# Patient Record
Sex: Female | Born: 1985 | Race: White | Hispanic: No | Smoking: Current every day smoker
Health system: Southern US, Community
[De-identification: ages and names within clinical notes are randomized; demographics above are authoritative.]

---
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
## 2006-04-11 ENCOUNTER — Emergency Department: Payer: Self-pay

## 2006-04-19 ENCOUNTER — Emergency Department: Payer: Self-pay | Admitting: Emergency Medicine

## 2006-04-20 ENCOUNTER — Emergency Department: Payer: Self-pay | Admitting: Emergency Medicine

## 2006-05-06 ENCOUNTER — Emergency Department: Payer: Self-pay | Admitting: Emergency Medicine

## 2006-06-03 ENCOUNTER — Observation Stay: Payer: Self-pay

## 2006-08-20 ENCOUNTER — Observation Stay: Payer: Self-pay

## 2006-09-22 ENCOUNTER — Observation Stay: Payer: Self-pay | Admitting: Obstetrics and Gynecology

## 2006-09-26 ENCOUNTER — Inpatient Hospital Stay: Payer: Self-pay | Admitting: Obstetrics & Gynecology

## 2007-08-13 ENCOUNTER — Emergency Department: Payer: Self-pay | Admitting: Internal Medicine

## 2008-12-16 ENCOUNTER — Observation Stay: Payer: Self-pay

## 2009-02-05 ENCOUNTER — Ambulatory Visit: Payer: Self-pay | Admitting: Obstetrics & Gynecology

## 2009-02-06 ENCOUNTER — Inpatient Hospital Stay: Payer: Self-pay

## 2009-05-29 ENCOUNTER — Emergency Department: Payer: Self-pay

## 2009-06-04 ENCOUNTER — Emergency Department: Payer: Self-pay | Admitting: Unknown Physician Specialty

## 2012-08-11 ENCOUNTER — Inpatient Hospital Stay: Payer: Self-pay | Admitting: Psychiatry

## 2012-08-11 LAB — TSH: Thyroid Stimulating Horm: 1.4 u[IU]/mL

## 2012-08-11 LAB — URINALYSIS, COMPLETE
Blood: NEGATIVE
Glucose,UR: NEGATIVE mg/dL (ref 0–75)
Ketone: NEGATIVE
Nitrite: NEGATIVE
RBC,UR: 1 /HPF (ref 0–5)
Specific Gravity: 1.014 (ref 1.003–1.030)
Squamous Epithelial: <1
WBC UR: 1 /HPF (ref 0–5)

## 2012-08-11 LAB — COMPREHENSIVE METABOLIC PANEL
Albumin: 4.1 g/dL (ref 3.4–5.0)
Alkaline Phosphatase: 101 U/L (ref 50–136)
BUN: 15 mg/dL (ref 7–18)
Bilirubin,Total: 0.3 mg/dL (ref 0.2–1.0)
Chloride: 103 mmol/L (ref 98–107)
Co2: 24 mmol/L (ref 21–32)
Creatinine: 0.74 mg/dL (ref 0.60–1.30)
Glucose: 92 mg/dL (ref 65–99)
SGOT(AST): 24 U/L (ref 15–37)
SGPT (ALT): 25 U/L (ref 12–78)
Total Protein: 7.6 g/dL (ref 6.4–8.2)

## 2012-08-11 LAB — PREGNANCY, URINE: Pregnancy Test, Urine: NEGATIVE m[IU]/mL

## 2012-08-11 LAB — DRUG SCREEN, URINE
Barbiturates, Ur Screen: NEGATIVE (ref ?–200)
Benzodiazepine, Ur Scrn: POSITIVE (ref ?–200)
Cannabinoid 50 Ng, Ur ~~LOC~~: NEGATIVE (ref ?–50)
Cocaine Metabolite,Ur ~~LOC~~: NEGATIVE (ref ?–300)
MDMA (Ecstasy)Ur Screen: NEGATIVE (ref ?–500)
Methadone, Ur Screen: NEGATIVE (ref ?–300)
Opiate, Ur Screen: POSITIVE (ref ?–300)

## 2012-08-11 LAB — ACETAMINOPHEN LEVEL: Acetaminophen: <2 ug/mL

## 2012-08-11 LAB — CBC
HGB: 13.4 g/dL (ref 12.0–16.0)
MCH: 31.5 pg (ref 26.0–34.0)
Platelet: 265 10*3/uL (ref 150–440)
RDW: 13.3 % (ref 11.5–14.5)
WBC: 7.9 10*3/uL (ref 3.6–11.0)

## 2012-08-11 LAB — ETHANOL: Ethanol %: 0.01 % (ref 0.000–0.080)

## 2014-12-16 NOTE — H&P (Signed)
PATIENT NAME:  Christina Cohen, Christina Cohen MR#:  [id number] DATE OF BIRTH:  [birth year]  DATE OF ADMISSION:  08/11/2012  INITIAL ASSESSMENT AND PSYCHIATRIC EVALUATION  IDENTYING INFORMATION: The patient is a 29 year old white female not employed and last worked in August of 2012 and could not get a job because they found out about her felony charges. The patient is single, never married, and calls herself as homeless and has no place to go. The patient comes for inpatient hospitalization to psychiatry at [facility] with the chief compliant, "to get off of drugs.  I need help with my detox from IV usage of Opana and heroin."    HISTORY OF PRESENT ILLNESS: When the patient was asked when she last felt well, she reported just prior to admission when she took her IV drugs.  She has been using at the rate of Opana 160 mg per day, 10 bags of heroin per day, IV for the past 3 years on a continuous basis. She used it just prior to her admission.   PAST PSYCHIATRIC HISTORY: According to information obtained, she had detox done two years ago for similar problems. This detox was done at [facility] in [place], [place], and stayed for a year and started using IV drugs when her grandfather died. She reports she was given some Subutex at [facility]. No history of suicide attempts. Not being followed by any psychiatrist at this time.  FAMILY HISTORY OF MENTAL ILLNESS: No known history of mental illness. No history of suicides in the family.   FAMILY HISTORY: Raised by parents. Father is at home and does nothing. Father is living and [id number] years old. Mother stays at home. Mother is living and [id number] years old. Has 10 siblings. Close to family.   PERSONAL HISTORY: [place]. Got GED and took some college courses.   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 She last worked in August of 2013 and they let her go when they found out about her felony charges.   MILITARY HISTORY: None.  MARRIAGES: Never married. Has three children who are 754 years old, 28 years old and 10738 years old from two different men. She does not have custody of any of these children and one lives with the father and the other lives with her aunt. She gets to see them sometimes.   LEGAL CHARGES: She has been to jail three times for different things; misdemeanor, larceny and delivering control substance. She has been to prison once for delivering controlled substance. No legal charges pending, not on probation.   ALCOHOL AND DRUGS: First drink of alcohol was at a young age. No problems with alcohol drinking. No history of DWIs, never been arrested for public drunkenness. She does admit using IV heroin and Opana for the past 3-1/2 years.  She denies using crack cocaine, denies using THC. She does admit smoking nicotine cigarettes at the rate of two to three packs a day for many years.   MEDICAL HISTORY: No known history of high blood pressure, no known history of diabetes mellitus, status post three C-sections, no major injuries, no history of motor vehicle accident and has never been unconscious. No known drug allergies. She does not have any physician and goes to the emergency room as needed.  PHYSICAL EXAMINATION:  VITALS: Temperature is 98.2, pulse is 84 per minute and regular, respirations 18 per minute and regular and blood pressure is 106/70 mmHg.  HEENT: Head is normocephalic, atraumatic. Eyes PERRLA. Fundi bilaterally benign. EOMs visualized. Tympanic membranes have no exudate. Oral cavity with poor dentition.   NECK: Supple without any organomegaly, lymphadenopathy or thyromegaly.   CHEST: Normal expansion, normal breath sounds heard.  HEART: Normal S1 and S2 without any murmurs, rubs or gallops.  ABDOMEN: Soft. No organomegaly. Bowel sounds heard.   SKIN:  Tracks from IV usage of drugs can be seen.  EXTREMITIES: Nontender. Normal range of motion. No signs of injury. No pedal edema.   RECTAL/PELVIC: Deferred.  NEUROLOGIC: Gait is normal. Romberg is negative. Cranial nerves II through XII are grossly intact. DTRs are 2+ and normal. Plantars have normal response.  MENTAL STATUS EXAMINATION: The patient is dressed in hospital scrubs, appears drowsy but easily arousable, oriented to place, person and time and fully aware of the situation that brought her for admission to [facility].  Affect is neutral. She denies feeling depressed, denies feeling hopeless and helpless, denies feeling worthless or useless and she wants help for her opioid dependence. Denies auditory or visual hallucinations, denies paranoid or suspicious ideas, denies thought insertion or thought control and denies having any grandiose ideas. She can spell the word world forward, could not spell it backward because she was feeling too drowsy. Does admit to sleep and appetite disturbance when she is using drugs. Memory and recall are fair. Insight and judgment guarded.  IMPRESSION:  AXIS I:  1. Opioid dependence, chronic, continuous. 2. Nicotine dependence.   3. Substance-induced mood disorder.  AXIS II: Deferred.  AXIS III: Status post three C-sections.  AXIS IV: Severe - occupation, financial, comorbid substance abuse and being homeless.  AXIS V: GAF 20.   PLAN: The patient is admitted to [facility] for closer observation, evaluation and help. She will be started on clonidine detox for her opioid dependence problems and p.r.Cohen. medications will be given as needed. During the stay in the hospital, she will be given milieu therapy and supportive counseling. She will take part in individual and group therapy where    substance abuse will be addressed. At the time of discharge, detox will be completed and appropriate followup appointments and homeless issues will be addressed  by social services. ____________________________ Jannet Mantis. Guss Bunde, MD skc:sb D: 08/11/2012 19:10:01 ET T: 08/12/2012 10:35:13 ET JOB#: [id number]  cc: Monika Salk K. Guss Bunde, MD, <Dictator> Beau Fanny MD ELECTRONICALLY SIGNED 08/12/2012 21:24

## 2014-12-16 NOTE — Discharge Summary (Signed)
PATIENT NAME:  Christina Cohen, Christina Cohen MR#:  [id number] DATE OF BIRTH:  1986/02/04  DATE OF ADMISSION:  08/11/2012 DATE OF DISCHARGE:  08/13/2012  HOSPITAL COURSE: See dictated history and physical for details of admission.  This 29 year old woman was admitted to the hospital voluntarily on the 14th with a request for opiate dependence. She reported that she had been using heroin and other opiates and felt like she was not successful at being able to stop on her own and was concerned about having opiate withdrawal symptoms. She was not reporting symptoms of acute depression, and she denied suicidal and homicidal ideation. At the time of admission the patient indicated that she was not married. Once she was admitted, it transpired that she and her boyfriend, whom she began referring to as her husband ,were admitted at the same time, both for opiate detox. They were observed by staff engaging in inappropriate physical contact and going into one another's rooms. Staff felt that this was disruptive to the milieu and to the treatment environment. The patient was confronted with this when I first met her on the 16th.  By that time she reported that she wanted to be discharged. She denied any suicidal or homicidal ideation. She stated that she would agree to follow-up treatment in the community and that she had a place to live.  She did not present as being acutely dangerous or committable.  Discharge was done on December 16th.  At that time she was on no medication. At no time in her hospital stay did she behave in a dangerous or aggressive manner or voice any acute suicidal ideation.   DISCHARGE MEDICATIONS: None.   DISPOSITION: Discharge with follow-up to be arranged at [facility], staying at the home of her husband's family.   LABORATORY RESULTS:  Admission chemistries all normal. Alcohol 10, which is low but not undetectable. CBC normal. Salicylates slightly elevated but not toxic at 3.3. TSH  normal at 1.4. Drug screen positive for opiates and benzodiazepines. Urinalysis unremarkable. Pregnancy test negative.   MENTAL STATUS EXAM AT DISCHARGE:  Casually dressed, neatly groomed woman, looks her stated age. Cooperative with the interview. Good eye contact. Normal psychomotor activity. No tremor. Affect is reactive and euthymic. Mood stated as being okay. Thoughts are lucid without loosening of associations or delusions. Denies auditory or visual hallucinations. Denies suicidal or homicidal ideation. Shows improved judgment and insight. Normal intelligence. Alert and oriented x 4.   DIAGNOSIS PRINCIPAL AND PRIMARY: AXIS I: Opiate dependence.   SECONDARY DIAGNOSES: AXIS I: No further.  AXIS II: No diagnosis.  AXIS III: No diagnosis.  AXIS IV: Moderate to severe from ongoing opiate dependence and multiple psychosocial losses related to it.  AXIS V: Functioning at time of discharge 60.    ____________________________ Gonzella Lex, MD jtc:ct D: 08/27/2012 09:39:42 ET T: 08/27/2012 10:01:09 ET JOB#: [id number]  cc: Gonzella Lex, MD, <Dictator> Gonzella Lex MD ELECTRONICALLY SIGNED 08/27/2012 16:29

## 2014-12-21 ENCOUNTER — Emergency Department
Admit: 2014-12-21 | Disposition: A | Discharge status: Other Acute Inpatient Hospital | Payer: Self-pay | Admitting: Emergency Medicine

## 2014-12-21 ENCOUNTER — Inpatient Hospital Stay (HOSPITAL_COMMUNITY)
Admission: EM | Admit: 2014-12-21 | Discharge: 2014-12-25 | DRG: 917 | Disposition: A | Discharge status: Home / Self Care | Payer: Self-pay | Source: Other Acute Inpatient Hospital | Attending: Internal Medicine | Admitting: Internal Medicine

## 2014-12-21 ENCOUNTER — Inpatient Hospital Stay (HOSPITAL_COMMUNITY): Payer: Self-pay

## 2014-12-21 DIAGNOSIS — R4182 Altered mental status, unspecified: Secondary | ICD-10-CM | POA: Diagnosis present

## 2014-12-21 DIAGNOSIS — D696 Thrombocytopenia, unspecified: Secondary | ICD-10-CM | POA: Diagnosis not present

## 2014-12-21 DIAGNOSIS — F17210 Nicotine dependence, cigarettes, uncomplicated: Secondary | ICD-10-CM | POA: Diagnosis present

## 2014-12-21 DIAGNOSIS — Z681 Body mass index (BMI) 19 or less, adult: Secondary | ICD-10-CM

## 2014-12-21 DIAGNOSIS — F319 Bipolar disorder, unspecified: Secondary | ICD-10-CM | POA: Diagnosis present

## 2014-12-21 DIAGNOSIS — J9600 Acute respiratory failure, unspecified whether with hypoxia or hypercapnia: Secondary | ICD-10-CM | POA: Diagnosis present

## 2014-12-21 DIAGNOSIS — T403X1A Poisoning by methadone, accidental (unintentional), initial encounter: Secondary | ICD-10-CM | POA: Diagnosis present

## 2014-12-21 DIAGNOSIS — E876 Hypokalemia: Secondary | ICD-10-CM | POA: Diagnosis present

## 2014-12-21 DIAGNOSIS — F1120 Opioid dependence, uncomplicated: Secondary | ICD-10-CM | POA: Diagnosis present

## 2014-12-21 DIAGNOSIS — G92 Toxic encephalopathy: Secondary | ICD-10-CM | POA: Diagnosis present

## 2014-12-21 DIAGNOSIS — T424X1A Poisoning by benzodiazepines, accidental (unintentional), initial encounter: Principal | ICD-10-CM | POA: Diagnosis present

## 2014-12-21 DIAGNOSIS — B1920 Unspecified viral hepatitis C without hepatic coma: Secondary | ICD-10-CM | POA: Diagnosis present

## 2014-12-21 DIAGNOSIS — R400 Somnolence: Secondary | ICD-10-CM | POA: Insufficient documentation

## 2014-12-21 DIAGNOSIS — F10129 Alcohol abuse with intoxication, unspecified: Secondary | ICD-10-CM | POA: Diagnosis present

## 2014-12-21 DIAGNOSIS — E872 Acidosis: Secondary | ICD-10-CM | POA: Diagnosis present

## 2014-12-21 DIAGNOSIS — T401X1A Poisoning by heroin, accidental (unintentional), initial encounter: Secondary | ICD-10-CM | POA: Diagnosis present

## 2014-12-21 DIAGNOSIS — F19288 Other psychoactive substance dependence with other psychoactive substance-induced disorder: Secondary | ICD-10-CM | POA: Diagnosis present

## 2014-12-21 DIAGNOSIS — Z978 Presence of other specified devices: Secondary | ICD-10-CM

## 2014-12-21 LAB — DRUG SCREEN, URINE
AMPHETAMINES, UR SCREEN: NEGATIVE
Barbiturates, Ur Screen: NEGATIVE
Benzodiazepine, Ur Scrn: POSITIVE
COCAINE METABOLITE, UR ~~LOC~~: NEGATIVE
Cannabinoid 50 Ng, Ur ~~LOC~~: NEGATIVE
MDMA (Ecstasy)Ur Screen: NEGATIVE
Methadone, Ur Screen: POSITIVE
Opiate, Ur Screen: NEGATIVE
Phencyclidine (PCP) Ur S: NEGATIVE
Tricyclic, Ur Screen: NEGATIVE

## 2014-12-21 LAB — URINALYSIS, COMPLETE
BILIRUBIN, UR: NEGATIVE
BLOOD: NEGATIVE
GLUCOSE, UR: NEGATIVE mg/dL (ref 0–75)
Ketone: NEGATIVE
NITRITE: POSITIVE
Ph: 5 (ref 4.5–8.0)
Protein: NEGATIVE
SPECIFIC GRAVITY: 1.015 (ref 1.003–1.030)

## 2014-12-21 LAB — COMPREHENSIVE METABOLIC PANEL
AST: 86 U/L — AB
Albumin: 4.2 g/dL
Alkaline Phosphatase: 502 U/L — ABNORMAL HIGH
Anion Gap: 8 (ref 7–16)
BILIRUBIN TOTAL: 0.3 mg/dL
BUN: 15 mg/dL
CHLORIDE: 105 mmol/L
CO2: 26 mmol/L
Calcium, Total: 9.6 mg/dL
Creatinine: 0.91 mg/dL
EGFR (African American): >60
GLUCOSE: 92 mg/dL
Potassium: 4.1 mmol/L
SGPT (ALT): 97 U/L — ABNORMAL HIGH
SODIUM: 139 mmol/L
Total Protein: 7.9 g/dL

## 2014-12-21 LAB — CBC
HCT: 36.4 % (ref 35.0–47.0)
HGB: 12.5 g/dL (ref 12.0–16.0)
MCH: 31.3 pg (ref 26.0–34.0)
MCHC: 34.5 g/dL (ref 32.0–36.0)
MCV: 91 fL (ref 80–100)
Platelet: 210 10*3/uL (ref 150–440)
RBC: 4.01 10*6/uL (ref 3.80–5.20)
RDW: 14.8 % — ABNORMAL HIGH (ref 11.5–14.5)
WBC: 5 10*3/uL (ref 3.6–11.0)

## 2014-12-21 LAB — ETHANOL

## 2014-12-21 LAB — ACETAMINOPHEN LEVEL: Acetaminophen: <10 ug/mL

## 2014-12-21 LAB — SALICYLATE LEVEL: Salicylates, Serum: <4 mg/dL

## 2014-12-21 MED ORDER — ONDANSETRON HCL 4 MG/2ML IJ SOLN: 4.0000 mg | Freq: Four times a day (QID) | INTRAMUSCULAR | Status: DC | PRN

## 2014-12-21 MED ORDER — FENTANYL BOLUS VIA INFUSION
50.0000 ug | INTRAVENOUS | Status: DC | PRN
  Filled 2014-12-21: qty 50

## 2014-12-21 MED ORDER — FENTANYL CITRATE (PF) 100 MCG/2ML IJ SOLN: 50.0000 ug | Freq: Once | INTRAMUSCULAR | Status: DC

## 2014-12-21 MED ORDER — MIDAZOLAM HCL 2 MG/2ML IJ SOLN
2.0000 mg | INTRAMUSCULAR | Status: DC | PRN
  Administered 2014-12-22: 1 mg via INTRAVENOUS
  Filled 2014-12-21: qty 2

## 2014-12-21 MED ORDER — SODIUM CHLORIDE 0.9 % IV SOLN
25.0000 ug/h | INTRAVENOUS | Status: DC
  Filled 2014-12-21: qty 50

## 2014-12-21 MED ORDER — DOCUSATE SODIUM 50 MG/5ML PO LIQD
100.0000 mg | Freq: Two times a day (BID) | ORAL | Status: DC | PRN
  Filled 2014-12-21: qty 10

## 2014-12-21 MED ORDER — SODIUM CHLORIDE 0.9 % IV SOLN
250.0000 mL | INTRAVENOUS | Status: DC | PRN
  Administered 2014-12-22: 250 mL via INTRAVENOUS

## 2014-12-21 MED ORDER — HEPARIN SODIUM (PORCINE) 5000 UNIT/ML IJ SOLN
5000.0000 [IU] | Freq: Three times a day (TID) | INTRAMUSCULAR | Status: DC
  Administered 2014-12-22 – 2014-12-24 (×8): 5000 [IU] via SUBCUTANEOUS
  Filled 2014-12-21 (×11): qty 1

## 2014-12-21 MED ORDER — FAMOTIDINE 40 MG/5ML PO SUSR
20.0000 mg | Freq: Two times a day (BID) | ORAL | Status: DC
Start: 2014-12-22 — End: 2014-12-22
  Administered 2014-12-22: 20 mg
  Filled 2014-12-21 (×5): qty 2.5

## 2014-12-21 MED ORDER — MIDAZOLAM HCL 2 MG/2ML IJ SOLN
2.0000 mg | INTRAMUSCULAR | Status: DC | PRN
  Filled 2014-12-21: qty 2

## 2014-12-21 NOTE — H&P (Signed)
PULMONARY  / CRITICAL CARE  MEDICINE  Name: Christina Cohen MRN: [id number] DOB: 05-15-1986    LOS: 1  REFERRING [facility] :  [facility]  CHIEF COMPLAINT:  Altered mental status  BRIEF PATIENT DESCRIPTION: Christina Cohen is a 29 year old female with history of bipolar disorder who presented to the ED in police custody for altered mental status initially attributed to alcohol intoxication.  HISTORY OF PRESENT ILLNESS:  Christina Cohen is a 29 year old female with history of bipolar disorder who presented to the ED in police custody for altered mental status initially attributed to alcohol intoxication. At the police department, she was verbalizing initially but then became unresponsive after 2 blood draws were performed. She was then referred to the emergency department and found to have UDS notable for positive benzodiazepine and methadone along with a stool covered bag with 19 ovoid blue pills in her gluteal cleft. She then received ceftriaxone 1 for UA notable for nitrites and leukocyte esterase. She was intubated for inability to protect her airway and transferred here for further management.   PAST MEDICAL HISTORY :  No past medical history on file. opoid abuse, etoh abuse No past surgical history on file. Prior to Admission medications   Not on File   Allergies not on file No known DA  FAMILY HISTORY:  No family history on file. unable to obtain, intubated SOCIAL HISTORY:  has no tobacco, alcohol, and drug history on file. opoid abuse, etoh abuse  REVIEW OF SYSTEMS:  Deferred given intubation. unable to obtain  VITAL SIGNS: Temp:  [98.8 F (37.1 C)] 98.8 F (37.1 C) (04/24 2334) Pulse Rate:  [82-88] 82 (04/24 2334) Resp:  [14] 14 (04/24 2334) BP: (98)/(53) 98/53 mmHg (04/24 2334) SpO2:  [99 %-100 %] 100 % (04/24 2334) FiO2 (%):  [40 %] 40 % (04/24 2334) Weight:  [107 lb 9.4 oz (48.8 kg)] 107 lb 9.4 oz (48.8 kg) (04/24 2334) HEMODYNAMICS:   VENTILATOR SETTINGS: Vent Mode:   [-] PRVC FiO2 (%):  [40 %] 40 % Set Rate:  [14 bmp] 14 bmp Vt Set:  [400 mL] 400 mL PEEP:  [5 cmH20] 5 cmH20 Plateau Pressure:  [16 cmH20] 16 cmH20 INTAKE / OUTPUT: Intake/Output    None     PHYSICAL EXAMINATION: General: Thin Caucasian female, agitated Neuro: Agitated, unable to follow commands HEENT: PERRL, left eye with conjunctival erythema as compared to right  but no scleral icterus, ETT  Cardiac: RRR, no rubs, murmurs or gallops Pulm: clear to auscultation bilaterally in the anterior lung fields, no wheezes, rales, or rhonchi Abd: soft, nontender, nondistended, BS present Ext: warm and well perfused, no pedal  or tibial edema, multiple tattoos noted   DIAGNOSES: Active Problems:   Altered mental status   ASSESSMENT / PLAN:  PULMONARY  ASSESSMENT: Acute respiratory failure: Likely secondary to acute toxic encephalopathy Mild hilar prominence PLAN:   Continue mechanical ventilatory support: PRVC, 8 cc/KG  Check Chest X-Ray Wean cpap 5 ps 5, goal 30 min  Assess rsbi, cough strength May need lasix  CARDIOVASCULAR  ASSESSMENT:  No acute issues.  PLAN Continue following Tele Get tox screen, may need ecg  RENAL  ASSESSMENT:   pulm edema mild? At risk AG acidosis  PLAN: Recheck CMET  Limit narcs as able May need lasix hydration  GASTROINTESTINAL  ASSESSMENT:   Abnormal LFTs: Elevated AST, ALT, alkaline phosphatase at outside hospital - etoh R/o mild acute alcoholic hepatitis No residual objects in rectum  PLAN:   Recheck  CMET in am  Tf if not extubated Give Pepcid 20 mg twice daily for stress ulcer prophylaxis Acute hep panel etoh level Sal, tylenol levels   HEMATOLOGIC  ASSESSMENT:   No acute issues  PLAN:  Recheck CBC  Heparin for VTE prophylaxis  INFECTIOUS  ASSESSMENT:   No acute issues: UA at outside hospital had significant epithelial cells which raises concern for contamination.   PLAN:   Continue following fever  curve Repeat UA No asp on pcxr  ENDOCRINE  ASSESSMENT:   EOTH intox LFT elevation PLAN:   Continue following cbg hydration  NEUROLOGIC  ASSESSMENT:   Acute toxic encephalopathy: UDS notable for benzos, methadone Bipolar disorder Polysubstance abuse  PLAN:   RASS 0: sedation with Versed prn, limit Continue assessing mental status Place restraints Consider sitter order WUA May need psych  CLINICAL SUMMARY: Christina Cohen is a 29 year old female with bipolar disorder, polysubstance abuse who presented to OSH with acute toxic encephalopathy complicated by acute respiratory failure. Circumstances surrounding polysubstance abuse unknown though suicidal ideation cannot be excluded.  Heywood Iles, PGY1 Internal Medicine Pager: [phone]  12/22/2014, 12:06 AM   STAFF NOTE: Cindi Carbon, MD FACP have personally reviewed patient's available data, including medical history, events of note, physical examination and test results as part of my evaluation. I have discussed with resident/NP and other care providers such as pharmacist, RN and RRT. In addition, I personally evaluated patient and elicited key findings of: awake now, hydration, cta lungs, assess etiology LFT, weaning, assess neuro resp strength, rsbi, chem in am , send hep panel, thiamine , folic addition, get UA, ecg, tox screen The patient is critically ill with multiple organ systems failure and requires high complexity decision making for assessment and support, frequent evaluation and titration of therapies, application of advanced monitoring technologies and extensive interpretation of multiple databases.   Critical Care Time devoted to patient care services described in this note is35 Minutes. This time reflects time of care of this signee: Rory Percy, MD FACP. This critical care time does not reflect procedure time, or teaching time or supervisory time of PA/NP/Med student/Med Resident etc but could involve  care discussion time. Rest per NP/medical resident whose note is outlined above and that I agree with   Mcarthur Rossetti. Tyson Alias, MD, FACP Pgr: [phone] [facility] 12/22/2014 8:44 AM

## 2014-12-22 DIAGNOSIS — Z789 Other specified health status: Secondary | ICD-10-CM

## 2014-12-22 DIAGNOSIS — R400 Somnolence: Secondary | ICD-10-CM

## 2014-12-22 DIAGNOSIS — F11222 Opioid dependence with intoxication with perceptual disturbance: Secondary | ICD-10-CM

## 2014-12-22 DIAGNOSIS — F19288 Other psychoactive substance dependence with other psychoactive substance-induced disorder: Secondary | ICD-10-CM | POA: Diagnosis present

## 2014-12-22 DIAGNOSIS — Z978 Presence of other specified devices: Secondary | ICD-10-CM | POA: Insufficient documentation

## 2014-12-22 LAB — URINALYSIS, ROUTINE W REFLEX MICROSCOPIC
Bilirubin Urine: NEGATIVE
Glucose, UA: NEGATIVE mg/dL
Hgb urine dipstick: NEGATIVE
Ketones, ur: 15 mg/dL — AB
Nitrite: NEGATIVE
Protein, ur: NEGATIVE mg/dL
SPECIFIC GRAVITY, URINE: 1.016 (ref 1.005–1.030)
Urobilinogen, UA: 0.2 mg/dL (ref 0.0–1.0)
pH: 5.5 (ref 5.0–8.0)

## 2014-12-22 LAB — COMPREHENSIVE METABOLIC PANEL
ALK PHOS: 396 U/L — AB (ref 39–117)
ALT: 83 U/L — AB (ref 0–35)
AST: 89 U/L — ABNORMAL HIGH (ref 0–37)
Albumin: 3.4 g/dL — ABNORMAL LOW (ref 3.5–5.2)
Anion gap: 12 (ref 5–15)
BILIRUBIN TOTAL: 0.8 mg/dL (ref 0.3–1.2)
BUN: 11 mg/dL (ref 6–23)
CHLORIDE: 102 mmol/L (ref 96–112)
CO2: 22 mmol/L (ref 19–32)
Calcium: 8.3 mg/dL — ABNORMAL LOW (ref 8.4–10.5)
Creatinine, Ser: 0.9 mg/dL (ref 0.50–1.10)
GFR calc Af Amer: >90 mL/min (ref >90)
GFR calc non Af Amer: 86 mL/min — ABNORMAL LOW (ref >90)
Glucose, Bld: 80 mg/dL (ref 70–99)
Potassium: 4.4 mmol/L (ref 3.5–5.1)
Sodium: 136 mmol/L (ref 135–145)
Total Protein: 6.5 g/dL (ref 6.0–8.3)

## 2014-12-22 LAB — URINE MICROSCOPIC-ADD ON

## 2014-12-22 LAB — GLUCOSE, CAPILLARY
Glucose-Capillary: 95 mg/dL (ref 70–99)
Glucose-Capillary: 101 mg/dL — ABNORMAL HIGH (ref 70–99)
Glucose-Capillary: 130 mg/dL — ABNORMAL HIGH (ref 70–99)

## 2014-12-22 LAB — ETHANOL

## 2014-12-22 LAB — BLOOD GAS, ARTERIAL
Acid-base deficit: 0.3 mmol/L (ref 0.0–2.0)
Bicarbonate: 24.8 mEq/L — ABNORMAL HIGH (ref 20.0–24.0)
DRAWN BY: 24513
FIO2: 0.4 %
O2 Saturation: 99 %
PEEP: 5 cmH2O
PH ART: 7.339 — AB (ref 7.350–7.450)
PO2 ART: 160 mmHg — AB (ref 80.0–100.0)
Patient temperature: 98.5
RATE: 14 resp/min
TCO2: 26.2 mmol/L (ref 0–100)
VT: 400 mL
pCO2 arterial: 47.2 mmHg — ABNORMAL HIGH (ref 35.0–45.0)

## 2014-12-22 LAB — HEPATITIS PANEL, ACUTE
HCV Ab: REACTIVE — AB
HEP B C IGM: NONREACTIVE
HEP B S AG: NEGATIVE
Hep A IgM: NONREACTIVE

## 2014-12-22 LAB — CBC
HCT: 36.4 % (ref 36.0–46.0)
Hemoglobin: 12.2 g/dL (ref 12.0–15.0)
MCH: 30.7 pg (ref 26.0–34.0)
MCHC: 33.5 g/dL (ref 30.0–36.0)
MCV: 91.7 fL (ref 78.0–100.0)
PLATELETS: 164 10*3/uL (ref 150–400)
RBC: 3.97 MIL/uL (ref 3.87–5.11)
RDW: 14.3 % (ref 11.5–15.5)
WBC: 4.8 10*3/uL (ref 4.0–10.5)

## 2014-12-22 LAB — RAPID URINE DRUG SCREEN, HOSP PERFORMED
Amphetamines: NOT DETECTED
Barbiturates: NOT DETECTED
Benzodiazepines: POSITIVE — AB
Cocaine: NOT DETECTED
Opiates: NOT DETECTED
Tetrahydrocannabinol: NOT DETECTED

## 2014-12-22 LAB — MRSA PCR SCREENING: MRSA by PCR: NEGATIVE

## 2014-12-22 LAB — SALICYLATE LEVEL: Salicylate Lvl: <4 mg/dL (ref 2.8–20.0)

## 2014-12-22 LAB — ACETAMINOPHEN LEVEL: Acetaminophen (Tylenol), Serum: <10 ug/mL — ABNORMAL LOW (ref 10–30)

## 2014-12-22 MED ORDER — ADULT MULTIVITAMIN W/MINERALS CH
1.0000 | ORAL_TABLET | Freq: Every day | ORAL | Status: DC
  Administered 2014-12-23 – 2014-12-25 (×3): 1 via ORAL
  Filled 2014-12-22 (×4): qty 1

## 2014-12-22 MED ORDER — CETYLPYRIDINIUM CHLORIDE 0.05 % MT LIQD: 7.0000 mL | Freq: Two times a day (BID) | OROMUCOSAL | Status: DC

## 2014-12-22 MED ORDER — SODIUM CHLORIDE 0.9 % IV SOLN: 250.0000 mL | INTRAVENOUS | Status: DC | PRN

## 2014-12-22 MED ORDER — THIAMINE HCL 100 MG/ML IJ SOLN
100.0000 mg | Freq: Every day | INTRAMUSCULAR | Status: DC
  Filled 2014-12-22 (×3): qty 1

## 2014-12-22 MED ORDER — VITAMIN B-1 100 MG PO TABS
100.0000 mg | ORAL_TABLET | Freq: Every day | ORAL | Status: DC
  Administered 2014-12-23 – 2014-12-25 (×3): 100 mg via ORAL
  Filled 2014-12-22 (×4): qty 1

## 2014-12-22 MED ORDER — CETYLPYRIDINIUM CHLORIDE 0.05 % MT LIQD
7.0000 mL | Freq: Four times a day (QID) | OROMUCOSAL | Status: DC
  Administered 2014-12-22: 7 mL via OROMUCOSAL

## 2014-12-22 MED ORDER — HALOPERIDOL LACTATE 5 MG/ML IJ SOLN
INTRAMUSCULAR | Status: AC
  Administered 2014-12-22: 2 mg
  Filled 2014-12-22: qty 1

## 2014-12-22 MED ORDER — HALOPERIDOL LACTATE 5 MG/ML IJ SOLN
2.0000 mg | Freq: Four times a day (QID) | INTRAMUSCULAR | Status: DC | PRN
  Administered 2014-12-22 – 2014-12-23 (×2): 2 mg via INTRAVENOUS
  Filled 2014-12-22 (×2): qty 1

## 2014-12-22 MED ORDER — CHLORHEXIDINE GLUCONATE 0.12 % MT SOLN
15.0000 mL | Freq: Two times a day (BID) | OROMUCOSAL | Status: DC
  Administered 2014-12-22: 15 mL via OROMUCOSAL
  Filled 2014-12-22: qty 15

## 2014-12-22 MED ORDER — FOLIC ACID 1 MG PO TABS
1.0000 mg | ORAL_TABLET | Freq: Every day | ORAL | Status: DC
  Administered 2014-12-23 – 2014-12-25 (×3): 1 mg via ORAL
  Filled 2014-12-22 (×4): qty 1

## 2014-12-22 MED ORDER — LORAZEPAM 2 MG/ML IJ SOLN
1.0000 mg | INTRAMUSCULAR | Status: DC | PRN
  Administered 2014-12-22: 2 mg via INTRAVENOUS
  Filled 2014-12-22: qty 1

## 2014-12-22 MED ORDER — NICOTINE 21 MG/24HR TD PT24
21.0000 mg | MEDICATED_PATCH | Freq: Every day | TRANSDERMAL | Status: DC
  Filled 2014-12-22: qty 1

## 2014-12-22 MED ORDER — LORAZEPAM 2 MG/ML IJ SOLN
1.0000 mg | Freq: Four times a day (QID) | INTRAMUSCULAR | Status: DC | PRN
  Administered 2014-12-22: 1 mg via INTRAVENOUS
  Filled 2014-12-22: qty 1

## 2014-12-22 MED ORDER — DEXTROSE-NACL 5-0.9 % IV SOLN
INTRAVENOUS | Status: AC
  Administered 2014-12-22: 12:00:00 via INTRAVENOUS

## 2014-12-22 MED ORDER — NICOTINE 14 MG/24HR TD PT24
14.0000 mg | MEDICATED_PATCH | Freq: Every day | TRANSDERMAL | Status: DC
  Filled 2014-12-22: qty 1

## 2014-12-22 MED ORDER — PHENOL 1.4 % MT LIQD
1.0000 | OROMUCOSAL | Status: DC | PRN
  Administered 2014-12-24: 1 via OROMUCOSAL
  Filled 2014-12-22: qty 177

## 2014-12-22 MED ORDER — METHADONE HCL 10 MG PO TABS
10.0000 mg | ORAL_TABLET | Freq: Four times a day (QID) | ORAL | Status: DC
  Administered 2014-12-22 – 2014-12-23 (×4): 10 mg via ORAL
  Filled 2014-12-22 (×5): qty 1

## 2014-12-22 MED FILL — Vecuronium Bromide For Inj 10 MG: INTRAVENOUS | Qty: 10 | Status: AC

## 2014-12-22 MED FILL — Water For Injection: INTRAMUSCULAR | Qty: 10 | Status: AC

## 2014-12-22 NOTE — Progress Notes (Signed)
PULMONARY  / CRITICAL CARE  MEDICINE  Name: Christina Cohen MRN: [id number] DOB: 11/22/1985    LOS: 1  REFERRING MD :  [facility]  CHIEF COMPLAINT:  Altered mental status  BRIEF PATIENT DESCRIPTION: Christina Cohen is a 29 year old female with history of bipolar disorder who presented to the ED in police custody for altered mental status initially attributed to alcohol intoxication but likely drug overdose who later developed acute toxic encephalopathy with respiratory distress.   LINES: OETT 4/24>> Foley 4/24>> OGT 4/24>>  ANTIBIOTICS: None  CULTURES: None  IMAGING: CXR 4/24>> No active disease ABD Xray 4/24>> Negative CXR 4/24>> Endotracheal tube seen ending 1.7 cm above the carina. This could be retracted approximately 1 cm, as deemed clinically appropriate. Enteric tube noted extending below the diaphragm. No acute cardiopulmonary process seen. CXR 4/24>>The tip of the endotracheal tube is 3.4 cm above the carina. The tip of the nasogastric tube projects over the gastric body. Low inspiratory volumes with mild bibasilar subsegmental atelectasis.  SUBJECTIVE:  Intubated, agitated. Able to answer questions and follow commands.   VITAL SIGNS: Temp:  [97.5 F (36.4 C)-98.8 F (37.1 C)] 97.5 F (36.4 C) (04/25 0742) Pulse Rate:  [81-94] 94 (04/25 0600) Resp:  [14-17] 15 (04/25 0600) BP: (98-121)/(53-72) 101/54 mmHg (04/25 0600) SpO2:  [99 %-100 %] 100 % (04/25 0600) FiO2 (%):  [40 %] 40 % (04/25 0600) Weight:  [107 lb 9.4 oz (48.8 kg)] 107 lb 9.4 oz (48.8 kg) (04/25 0500)   VENTILATOR SETTINGS: Vent Mode:  [-] PRVC FiO2 (%):  [40 %] 40 % Set Rate:  [14 bmp] 14 bmp Vt Set:  [400 mL] 400 mL PEEP:  [5 cmH20] 5 cmH20 Plateau Pressure:  [15 cmH20-16 cmH20] 15 cmH20 INTAKE / OUTPUT: Intake/Output      04/24 0701 - 04/25 0700 04/25 0701 - 04/26 0700   I.V. (mL/kg) 172.3 (3.5)    NG/GT 20    Total Intake(mL/kg) 192.3 (3.9)    Urine (mL/kg/hr) 375    Total  Output 375     Net -182.7           PHYSICAL EXAMINATION: General: Thin female, no acute distress Neuro: RASS 0 to +1, answering questions, moves all extremities HEENT: PERRL, ETT  Cardiac: RRR, no rubs, murmurs or gallops Pulm: CTA bilaterally in the anterior lung fields, no wheezes, rales, or rhonchi Abd: Soft, nondistended, BS present GU: Foley Ext: No lower extremity edema bilaterally, multiple tattoos noted  DIAGNOSES: Active Problems:   Altered mental status  ASSESSMENT / PLAN:  PULMONARY  ASSESSMENT: Acute respiratory failure: Likely secondary to acute toxic encephalopathy Respiratory Acidosis PLAN:   Vent bundle Consider extubation today  CARDIOVASCULAR  ASSESSMENT:  No acute issues PLAN Telemetry  RENAL  ASSESSMENT:   No acute issues PLAN: Repeat CMET tomorrow am  GASTROINTESTINAL  ASSESSMENT:   Abnormal LFTs: Elevated AST, ALT, alkaline phosphatase  PLAN:   Recheck CMET tomorrow am Consider acute hepatitis panel Pepcid 20 mg BID  HEMATOLOGIC  ASSESSMENT:   No acute issues PLAN:  Heparin for VTE prophylaxis  INFECTIOUS  ASSESSMENT:   No acute issues: UA at outside hospital had significant epithelial cells which raises concern for contamination PLAN:   Monitor Consider repeat UA if symptomatic  ENDOCRINE  ASSESSMENT:   No acute issues.   PLAN:   Continue following  NEUROLOGIC  ASSESSMENT:   Acute toxic encephalopathy: UDS notable for benzos, methadone Alcohol Intoxication Bipolar disorder Polysubstance abuse- follows at methadone clinic  Tobacco Abuse PLAN:   RASS goal 0, currently 0 to +1 Fentanyl gtt and prn Versed prn CIWA for alcohol with Ativan prn Nicotine patch Restart methadone per pharmacy once extubated (on 90 mg per day at clinic) Place restraints Consider sitter order  CLINICAL SUMMARY: Christina Cohen is a 29 year old female with bipolar disorder, polysubstance abuse who presented to OSH with acute toxic  encephalopathy complicated by acute respiratory failure secondary to alcohol intoxication and drug overdose.  Christina Alexanders, DO PGY-1 Internal Medicine Resident Pager # [phone] 12/22/2014 7:48 AM   STAFF NOTE: Cindi Carbon, MD FACP have personally reviewed patient's available data, including medical history, events of note, physical examination and test results as part of my evaluation. I have discussed with resident/NP and other care providers such as pharmacist, RN and RRT. In addition, I personally evaluated patient and elicited key findings of: calmer, appreciate psychology help, no WD noted, haldol prn, keep tele, ambulate, supp k , to triad, eating well maintain, clear lungs, may increase methadone further, ecg now  See my note prior  Mcarthur Rossetti. Tyson Alias, MD, FACP Pgr: [phone] [facility] 12/23/2014 9:28 AM

## 2014-12-22 NOTE — Progress Notes (Signed)
Sarah from [employer] called me around 1700 to tell me that when she turned on the camera in the patient's room, the patient's husband was suspiciously putting something in the crotch of his jeans. When I walked in the room, the husband appeared agitated. I asked him to remove his coat from the room and place it outside which he did. I stepped out of the room but opened the curtain and continued to monitor them from outside the room. The husband went over to the patient's purse and opened a white pill bottle. At this point, I re-entered the room to ask the patient an un-related question. The husband then stated that he would go to the dining hall to get her something to eat. He stepped out of the room to his belongings on the coat rack, then back into the room to hand her a doughnut. I watched the patient eat this doughnut which did not seem suspicious. I called Sarah from [employer] back to get a better description of what occurred. After speaking with Maralyn SagoSarah I went back into the room and asked the patient if I could take the white pill bottle out of her purse as it is against hospital policy. The patient stated that this was ok. I filled out the form titled "Receipt for patient's home medications stored by pharmacy" per pharmacy tech's request. When I took the completed form to pharmacy, the pharmacist stated that since this medication is not one we are administering, I needed to ask the patient's husband to take the medications home.  The medications were given to night shift nurse.

## 2014-12-22 NOTE — Progress Notes (Signed)
 2mg  versed wasted with Arnoldo LenisAlex Shyshko RN in sharps box

## 2014-12-22 NOTE — Progress Notes (Signed)
Wasted 1mg  versed with Arnoldo LenisAlex Shyshko in sharps box.

## 2014-12-22 NOTE — Consult Note (Signed)
[facility] Consult   Reason for Consult:  AMS, S/P polysubstance overdose and intubation and agitation Referring Physician:  Dr. Marvel Plan Patient Identification: MOZELLE REMLINGER MRN:  [id number] Principal Diagnosis: <principal problem not specified> Diagnosis:   Patient Active Problem List   Diagnosis Date Noted  . Altered mental status [R41.82] 12/21/2014    Total Time spent with patient: 1 hour  Subjective:   ANABELA CRAYTON is a 29 y.o. female patient admitted with AMS, S/P polysubstance overdose and intubation and agitation.  HPI:  Abigial Newville is a 29 year old female admitted to [facility] intensive care unit for altered mental status secondary to polysubstance overdose especially benzodiazepines and opioids. Patient reportedly known as a heroin dependent and reportedly overdosed on Xanax and found unresponsive at before admission to the hospital. Patient urine drug screen is positive for opiates and benzodiazepines. Patient reported she has been taking methadone 90 [facility] Patient was found severely irritable, agitated, hallucination and delusional thinking and required medication this morning. Reportedly patient is also having trouble with the relationship. Patient is a poor historian during this evaluation secondary to being sedated with medications . Case discussed with Dr. Marvel Plan and Titus Mould and agree with the medication to control her agitation and aggressive behavior. Patient denies history of bipolar disorder and acute psychiatric hospitalization. Patient denied current symptoms of suicidal, homicidal ideation.  Medical history: Patient with history of bipolar disorder who presented to the ED in police custody for altered mental status initially attributed to alcohol intoxication. At the police department, she was verbalizing initially but then became unresponsive after 2 blood draws were performed. She was then referred  to the emergency department and found to have UDS notable for positive benzodiazepine and methadone along with a stool covered bag with 19 ovoid blue pills in her gluteal cleft. She then received ceftriaxone 1 for UA notable for nitrites and leukocyte esterase. She was intubated for inability to protect her airway and transferred here for further management.  HPI Elements:   Location:  Altered mental status and substance abuse. Quality:  Poor. Severity:  Status post drug overdose (heroin, Xanax). Timing:  Conflict. Duration:  One-day. Context:  Unable to provide more details.  Past Medical History: No past medical history on file. No past surgical history on file. Family History: No family history on file. Social History:  History  Alcohol Use: Not on file     History  Drug Use Not on file    History   Social History  . Marital Status: Single    Spouse Name: N/A  . Number of Children: N/A  . Years of Education: N/A   Social History Main Topics  . Smoking status: Not on file  . Smokeless tobacco: Not on file  . Alcohol Use: Not on file  . Drug Use: Not on file  . Sexual Activity: Not on file   Other Topics Concern  . Not on file   Social History Narrative  . No narrative on file   Additional Social History:                          Allergies:  No Known Allergies  Labs:  Results for orders placed or performed during the hospital encounter of 12/21/14 (from the past 48 hour(s))  Glucose, capillary     Status: None   Collection Time: 12/21/14 11:27 PM  Result Value Ref Range   Glucose-Capillary 95  70 - 99 mg/dL  MRSA PCR Screening     Status: None   Collection Time: 12/22/14 12:00 AM  Result Value Ref Range   MRSA by PCR NEGATIVE NEGATIVE    Comment:        The GeneXpert MRSA Assay (FDA approved for NASAL specimens only), is one component of a comprehensive MRSA colonization surveillance program. It is not intended to diagnose MRSA infection nor to  guide or monitor treatment for MRSA infections.   CBC     Status: None   Collection Time: 12/22/14 12:54 AM  Result Value Ref Range   WBC 4.8 4.0 - 10.5 K/uL   RBC 3.97 3.87 - 5.11 MIL/uL   Hemoglobin 12.2 12.0 - 15.0 g/dL   HCT 36.4 36.0 - 46.0 %   MCV 91.7 78.0 - 100.0 fL   MCH 30.7 26.0 - 34.0 pg   MCHC 33.5 30.0 - 36.0 g/dL   RDW 14.3 11.5 - 15.5 %   Platelets 164 150 - 400 K/uL  Comprehensive metabolic panel     Status: Abnormal   Collection Time: 12/22/14 12:54 AM  Result Value Ref Range   Sodium 136 135 - 145 mmol/L   Potassium 4.4 3.5 - 5.1 mmol/L    Comment: SPECIMEN HEMOLYZED. HEMOLYSIS MAY AFFECT INTEGRITY OF RESULTS.   Chloride 102 96 - 112 mmol/L   CO2 22 19 - 32 mmol/L   Glucose, Bld 80 70 - 99 mg/dL   BUN 11 6 - 23 mg/dL   Creatinine, Ser 0.90 0.50 - 1.10 mg/dL   Calcium 8.3 (L) 8.4 - 10.5 mg/dL   Total Protein 6.5 6.0 - 8.3 g/dL   Albumin 3.4 (L) 3.5 - 5.2 g/dL   AST 89 (H) 0 - 37 U/L   ALT 83 (H) 0 - 35 U/L   Alkaline Phosphatase 396 (H) 39 - 117 U/L   Total Bilirubin 0.8 0.3 - 1.2 mg/dL   GFR calc non Af Amer 86 (L) >90 mL/min   GFR calc Af Amer >90 >90 mL/min    Comment: (NOTE) The eGFR has been calculated using the CKD EPI equation. This calculation has not been validated in all clinical situations. eGFR's persistently <90 mL/min signify possible Chronic Kidney Disease.    Anion gap 12 5 - 15  Blood gas, arterial     Status: Abnormal   Collection Time: 12/22/14  4:25 AM  Result Value Ref Range   FIO2 0.40 %   Delivery systems VENTILATOR    Mode PRESSURE REGULATED VOLUME CONTROL    VT 400 mL   Rate 14 resp/min   Peep/cpap 5.0 cm H20   pH, Arterial 7.339 (L) 7.350 - 7.450   pCO2 arterial 47.2 (H) 35.0 - 45.0 mmHg   pO2, Arterial 160.0 (H) 80.0 - 100.0 mmHg   Bicarbonate 24.8 (H) 20.0 - 24.0 mEq/L   TCO2 26.2 0 - 100 mmol/L   Acid-base deficit 0.3 0.0 - 2.0 mmol/L   O2 Saturation 99.0 %   Patient temperature 98.5    Collection site RIGHT  BRACHIAL    Drawn by [phone]    Sample type ARTERIAL DRAW    Allens test (pass/fail) PASS PASS    Vitals: Blood pressure 109/65, pulse 112, temperature 97.5 F (36.4 C), temperature source Oral, resp. rate 12, height '5\' 5"'  (1.651 m), weight 48.8 kg (107 lb 9.4 oz), SpO2 98 %.  Risk to Self:   Risk to Others:   Prior Inpatient Therapy:   Prior Outpatient  Therapy:    Current Facility-Administered Medications  Medication Dose Route Frequency Provider Last Rate Last Dose  . 0.9 %  sodium chloride infusion  250 mL Intravenous PRN Otho Bellows, MD      . antiseptic oral rinse (CPC / CETYLPYRIDINIUM CHLORIDE 0.05%) solution 7 mL  7 mL Mouth Rinse QID Collene Gobble, MD   7 mL at 12/22/14 0400  . chlorhexidine (PERIDEX) 0.12 % solution 15 mL  15 mL Mouth Rinse BID Collene Gobble, MD   15 mL at 12/22/14 0915  . docusate (COLACE) 50 MG/5ML liquid 100 mg  100 mg Per Tube BID PRN Jerrye Noble, MD      . famotidine (PEPCID) 40 MG/5ML suspension 20 mg  20 mg Per Tube BID Jerrye Noble, MD   20 mg at 12/22/14 0017  . fentaNYL (SUBLIMAZE) 2,500 mcg in sodium chloride 0.9 % 250 mL (10 mcg/mL) infusion  25-400 mcg/hr Intravenous Continuous Jerrye Noble, MD 7.5 mL/hr at 12/22/14 1034 75 mcg/hr at 12/22/14 1034  . fentaNYL (SUBLIMAZE) bolus via infusion 50 mcg  50 mcg Intravenous Q1H PRN Jerrye Noble, MD      . fentaNYL (SUBLIMAZE) injection 50 mcg  50 mcg Intravenous Once Jerrye Noble, MD   50 mcg at 12/22/14 0000  . folic acid (FOLVITE) tablet 1 mg  1 mg Oral Daily Otho Bellows, MD      . heparin injection 5,000 Units  5,000 Units Subcutaneous 3 times per day Jerrye Noble, MD   5,000 Units at 12/22/14 0603  . LORazepam (ATIVAN) injection 1-2 mg  1-2 mg Intravenous Q1H PRN Alexa Sherral Hammers, MD   2 mg at 12/22/14 0847  . midazolam (VERSED) injection 2 mg  2 mg Intravenous Q15 min PRN Jerrye Noble, MD      . midazolam (VERSED) injection 2 mg  2 mg Intravenous Q2H PRN Jerrye Noble, MD   1 mg at 12/22/14  1025  . multivitamin with minerals tablet 1 tablet  1 tablet Oral Daily Otho Bellows, MD      . ondansetron [facility]) injection 4 mg  4 mg Intravenous Q6H PRN Jerrye Noble, MD      . thiamine (VITAMIN B-1) tablet 100 mg  100 mg Oral Daily Otho Bellows, MD       Or  . thiamine (B-1) injection 100 mg  100 mg Intravenous Daily Otho Bellows, MD        Musculoskeletal: Strength & Muscle Tone: decreased Gait & Station: unable to stand Patient leans: N/A  Psychiatric Specialty Exam: Physical Exam as per history and physical   ROS substance abuse, irritability, agitation   Blood pressure 109/65, pulse 112, temperature 97.5 F (36.4 C), temperature source Oral, resp. rate 12, height '5\' 5"'  (1.651 m), weight 48.8 kg (107 lb 9.4 oz), SpO2 98 %.Body mass index is 17.9 kg/(m^2).  General Appearance: Guarded  Eye Contact::  Minimal  Speech:  Slow  Volume:  Decreased  Mood:  Anxious  Affect:  Constricted and Depressed  Thought Process:  Loose and Tangential  Orientation:  Full (Time, Place, and Person)  Thought Content:  Paranoid Ideation and Rumination  Suicidal Thoughts:  No  Homicidal Thoughts:  No  Memory:  Immediate;   Fair Recent;   Fair  Judgement:  Impaired  Insight:  Lacking  Psychomotor Activity:  Restlessness  Concentration:  Poor  Recall:  Poor  Fund of Knowledge:Poor  Language:  Fair  Akathisia:  Negative  Handed:  Right  AIMS (if indicated):     Assets:  Communication Skills Desire for Improvement Leisure Time Resilience Social Support  ADL's:  Impaired  Cognition: Impaired,  Mild  Sleep:      Medical Decision Making: Review of Psycho-Social Stressors (1), Review or order clinical lab tests (1), Established Problem, Worsening (2), Review or order medicine tests (1), Review of Medication Regimen & Side Effects (2) and Review of New Medication or Change in Dosage (2)  Treatment Plan Summary: Daily contact with patient to assess and evaluate symptoms and  progress in treatment and Medication management  Plan:  Recommended Haldol 2 mg and Ativan 1 mg IM/IV every 6 hours for agitation and aggressive behaviors Need to reevaluate for methadone needs-patient is seeking 90 mg which need to be verified at this time Patient may need involuntary commitment if tried to leave the hospital Patient does not meet criteria for psychiatric inpatient admission. Supportive therapy provided about ongoing stressors.  Appreciate psychiatric consultation and follow up as clinically required Please contact [phone] or [phone] if needs further assistance  Disposition: Patient disposition plan is to be reassessed when she is more stable.   Logun Colavito,JANARDHAHA R. 12/22/2014 10:53 AM

## 2014-12-22 NOTE — Progress Notes (Signed)
Wasted 25mL of Fentanyl with Anneita Minor RN in sink. Un-spiked bag of fentanyl given to pharmacist on floor, also witnessed by Anneita Minor RN.

## 2014-12-22 NOTE — Procedures (Signed)
Extubation Procedure Note  Patient Details:   Name: Christina Cohen DOB: 1986-02-20 MRN: [id number]   Airway Documentation:     Evaluation  O2 sats: stable throughout Complications: No apparent complications Patient did tolerate procedure well. Bilateral Breath Sounds: Clear, Diminished Suctioning: Airway Yes  Pt extubated per MD order. Placed on 4L [place] Lurlean LeydenDick, Derica Leiber Bailey 12/22/2014, 10:07 AM

## 2014-12-22 NOTE — Progress Notes (Signed)
Wasted 3mL versed from bag with Bronson Curbyesha Hervey RN in sink.

## 2014-12-22 NOTE — Progress Notes (Signed)
Pt has $15 in her wallet. Stated that she did not want the money placed in security safe box.

## 2014-12-22 NOTE — Progress Notes (Signed)
RT note-changed back to SBT per Dr. Tyson AliasFeinstein, pain medications decreased by RN. Tolerating well at this time.

## 2014-12-23 ENCOUNTER — Encounter (HOSPITAL_COMMUNITY): Payer: Self-pay | Admitting: *Deleted

## 2014-12-23 LAB — COMPREHENSIVE METABOLIC PANEL
ALBUMIN: 2.9 g/dL — AB (ref 3.5–5.2)
ALT: 67 U/L — AB (ref 0–35)
AST: 52 U/L — AB (ref 0–37)
Alkaline Phosphatase: 366 U/L — ABNORMAL HIGH (ref 39–117)
Anion gap: 6 (ref 5–15)
BUN: 6 mg/dL (ref 6–23)
CHLORIDE: 107 mmol/L (ref 96–112)
CO2: 24 mmol/L (ref 19–32)
CREATININE: 0.73 mg/dL (ref 0.50–1.10)
Calcium: 8.7 mg/dL (ref 8.4–10.5)
Glucose, Bld: 106 mg/dL — ABNORMAL HIGH (ref 70–99)
Potassium: 3.8 mmol/L (ref 3.5–5.1)
SODIUM: 137 mmol/L (ref 135–145)
TOTAL PROTEIN: 5.6 g/dL — AB (ref 6.0–8.3)
Total Bilirubin: 0.6 mg/dL (ref 0.3–1.2)

## 2014-12-23 LAB — GLUCOSE, CAPILLARY
GLUCOSE-CAPILLARY: 115 mg/dL — AB (ref 70–99)
GLUCOSE-CAPILLARY: 113 mg/dL — AB (ref 70–99)
GLUCOSE-CAPILLARY: 114 mg/dL — AB (ref 70–99)
Glucose-Capillary: 111 mg/dL — ABNORMAL HIGH (ref 70–99)
Glucose-Capillary: 139 mg/dL — ABNORMAL HIGH (ref 70–99)

## 2014-12-23 MED ORDER — NICOTINE 21 MG/24HR TD PT24
21.0000 mg | MEDICATED_PATCH | Freq: Every day | TRANSDERMAL | Status: DC
  Administered 2014-12-23 – 2014-12-25 (×3): 21 mg via TRANSDERMAL
  Filled 2014-12-23 (×3): qty 1

## 2014-12-23 MED ORDER — METHADONE HCL 10 MG PO TABS
20.0000 mg | ORAL_TABLET | Freq: Four times a day (QID) | ORAL | Status: DC
  Administered 2014-12-23 – 2014-12-25 (×9): 20 mg via ORAL
  Filled 2014-12-23 (×9): qty 2

## 2014-12-23 MED ORDER — POTASSIUM CHLORIDE CRYS ER 20 MEQ PO TBCR
40.0000 meq | EXTENDED_RELEASE_TABLET | Freq: Once | ORAL | Status: AC
  Administered 2014-12-23: 40 meq via ORAL
  Filled 2014-12-23: qty 2

## 2014-12-23 NOTE — Consult Note (Signed)
Psychiatry Consult follow-up  Reason for Consult:  AMS, S/P polysubstance overdose and intubation and agitation Referring Physician:  Dr. Marvel Plan Patient Identification: Christina Cohen MRN:  [id number] Principal Diagnosis: Polysubstance dependence including opioid type drug, episodic abuse, with perceptual disturbance Diagnosis:   Patient Active Problem List   Diagnosis Date Noted  . Polysubstance dependence including opioid type drug, episodic abuse, with perceptual disturbance [F11.222] 12/22/2014  . Endotracheally intubated [Z78.9]   . Altered mental status [R41.82] 12/21/2014    Total Time spent with patient: 30 minutes  Subjective:   Christina Cohen is a 29 y.o. female patient admitted with AMS, S/P polysubstance overdose and intubation and agitation.  HPI:  Christina Cohen is a 29 year old female admitted to [facility] intensive care unit for altered mental status secondary to polysubstance overdose especially benzodiazepines and opioids. Patient reportedly known as a heroin dependent and reportedly overdosed on Xanax and found unresponsive at before admission to the hospital. Patient urine drug screen is positive for opiates and benzodiazepines. Patient reported she has been taking methadone [facility] Patient was found severely irritable, agitated, hallucination and delusional thinking and required medication this morning. Reportedly patient is also having trouble with the relationship. Patient is a poor historian during this evaluation secondary to being sedated with medications . Case discussed with Dr. Marvel Plan and Titus Mould and agree with the medication to control her agitation and aggressive behavior. Patient denies history of bipolar disorder and acute psychiatric hospitalization. Patient denied current symptoms of suicidal, homicidal ideation.  Interval history: Patient complains feeling restlessness, sweating, irritability and stomach  cramps. Patient was started methadone 10 mg every 6 hours which he eventually increased to 20 mg to prevent opioid withdrawal symptoms. Patient does not have any agitation or aggressive behavior at this time. Patient has no regrets about her drug of abuse. Staff found patient with auditory hallucinations from time to time. Patient does not appear to be responding to internal stimuli during my evaluation. Patient denies history of bipolar illness and current symptoms of suicide/homicide ideation, intention or plans.  Medical history: Patient with history of bipolar disorder who presented to the ED in police custody for altered mental status initially attributed to alcohol intoxication. At the police department, she was verbalizing initially but then became unresponsive after 2 blood draws were performed. She was then referred to the emergency department and found to have UDS notable for positive benzodiazepine and methadone along with a stool covered bag with 19 ovoid blue pills in her gluteal cleft. She then received ceftriaxone 1 for UA notable for nitrites and leukocyte esterase. She was intubated for inability to protect her airway and transferred here for further management.  Past Medical History: No past medical history on file. No past surgical history on file. Family History: No family history on file. Social History:  History  Alcohol Use: Not on file     History  Drug Use Not on file    History   Social History  . Marital Status: Single    Spouse Name: N/A  . Number of Children: N/A  . Years of Education: N/A   Social History Main Topics  . Smoking status: Not on file  . Smokeless tobacco: Not on file  . Alcohol Use: Not on file  . Drug Use: Not on file  . Sexual Activity: Not on file   Other Topics Concern  . Not on file   Social History Narrative  . No narrative on  file   Additional Social History:                          Allergies:  No Known  Allergies  Labs:  Results for orders placed or performed during the hospital encounter of 12/21/14 (from the past 48 hour(s))  Glucose, capillary     Status: None   Collection Time: 12/21/14 11:27 PM  Result Value Ref Range   Glucose-Capillary 95 70 - 99 mg/dL  MRSA PCR Screening     Status: None   Collection Time: 12/22/14 12:00 AM  Result Value Ref Range   MRSA by PCR NEGATIVE NEGATIVE    Comment:        The GeneXpert MRSA Assay (FDA approved for NASAL specimens only), is one component of a comprehensive MRSA colonization surveillance program. It is not intended to diagnose MRSA infection nor to guide or monitor treatment for MRSA infections.   CBC     Status: None   Collection Time: 12/22/14 12:54 AM  Result Value Ref Range   WBC 4.8 4.0 - 10.5 K/uL   RBC 3.97 3.87 - 5.11 MIL/uL   Hemoglobin 12.2 12.0 - 15.0 g/dL   HCT 36.4 36.0 - 46.0 %   MCV 91.7 78.0 - 100.0 fL   MCH 30.7 26.0 - 34.0 pg   MCHC 33.5 30.0 - 36.0 g/dL   RDW 14.3 11.5 - 15.5 %   Platelets 164 150 - 400 K/uL  Comprehensive metabolic panel     Status: Abnormal   Collection Time: 12/22/14 12:54 AM  Result Value Ref Range   Sodium 136 135 - 145 mmol/L   Potassium 4.4 3.5 - 5.1 mmol/L    Comment: SPECIMEN HEMOLYZED. HEMOLYSIS MAY AFFECT INTEGRITY OF RESULTS.   Chloride 102 96 - 112 mmol/L   CO2 22 19 - 32 mmol/L   Glucose, Bld 80 70 - 99 mg/dL   BUN 11 6 - 23 mg/dL   Creatinine, Ser 0.90 0.50 - 1.10 mg/dL   Calcium 8.3 (L) 8.4 - 10.5 mg/dL   Total Protein 6.5 6.0 - 8.3 g/dL   Albumin 3.4 (L) 3.5 - 5.2 g/dL   AST 89 (H) 0 - 37 U/L   ALT 83 (H) 0 - 35 U/L   Alkaline Phosphatase 396 (H) 39 - 117 U/L   Total Bilirubin 0.8 0.3 - 1.2 mg/dL   GFR calc non Af Amer 86 (L) >90 mL/min   GFR calc Af Amer >90 >90 mL/min    Comment: (NOTE) The eGFR has been calculated using the CKD EPI equation. This calculation has not been validated in all clinical situations. eGFR's persistently <90 mL/min signify possible  Chronic Kidney Disease.    Anion gap 12 5 - 15  Blood gas, arterial     Status: Abnormal   Collection Time: 12/22/14  4:25 AM  Result Value Ref Range   FIO2 0.40 %   Delivery systems VENTILATOR    Mode PRESSURE REGULATED VOLUME CONTROL    VT 400 mL   Rate 14 resp/min   Peep/cpap 5.0 cm H20   pH, Arterial 7.339 (L) 7.350 - 7.450   pCO2 arterial 47.2 (H) 35.0 - 45.0 mmHg   pO2, Arterial 160.0 (H) 80.0 - 100.0 mmHg   Bicarbonate 24.8 (H) 20.0 - 24.0 mEq/L   TCO2 26.2 0 - 100 mmol/L   Acid-base deficit 0.3 0.0 - 2.0 mmol/L   O2 Saturation 99.0 %   Patient temperature 98.5  Collection site RIGHT BRACHIAL    Drawn by [phone]    Sample type ARTERIAL DRAW    Allens test (pass/fail) PASS PASS  Hepatitis panel, acute     Status: Abnormal   Collection Time: 12/22/14 12:00 PM  Result Value Ref Range   Hepatitis B Surface Ag NEGATIVE NEGATIVE   HCV Ab Reactive (A) NEGATIVE   Hep A IgM NON REACTIVE NON REACTIVE    Comment: (NOTE) Effective July 14, 2014, Hepatitis Acute Panel (test code [phone]) will be revised to automatically reflex to the Hepatitis C Viral RNA, Quantitative, Real-Time PCR assay if the Hepatitis C antibody screening result is Reactive. This action is being taken to ensure that the CDC/USPSTF recommended HCV diagnostic algorithm with the appropriate test reflex needed for accurate interpretation is followed.    Hep B C IgM NON REACTIVE NON REACTIVE    Comment: (NOTE) High levels of Hepatitis B Core IgM antibody are detectable during the acute stage of Hepatitis B. This antibody is used to differentiate current from past HBV infection. Performed at [facility]   Ethanol     Status: None   Collection Time: 12/22/14 12:00 PM  Result Value Ref Range   Alcohol, Ethyl (B) <5 0 - 9 mg/dL    Comment:        LOWEST DETECTABLE LIMIT FOR SERUM ALCOHOL IS 11 mg/dL FOR MEDICAL PURPOSES ONLY   Salicylate level     Status: None   Collection Time: 12/22/14  12:00 PM  Result Value Ref Range   Salicylate Lvl <4.0 2.8 - 20.0 mg/dL  Acetaminophen level     Status: Abnormal   Collection Time: 12/22/14 12:00 PM  Result Value Ref Range   Acetaminophen (Tylenol), Serum <10.0 (L) 10 - 30 ug/mL    Comment:        THERAPEUTIC CONCENTRATIONS VARY SIGNIFICANTLY. A RANGE OF 10-30 ug/mL MAY BE AN EFFECTIVE CONCENTRATION FOR MANY PATIENTS. HOWEVER, SOME ARE BEST TREATED AT CONCENTRATIONS OUTSIDE THIS RANGE. ACETAMINOPHEN CONCENTRATIONS >150 ug/mL AT 4 HOURS AFTER INGESTION AND >50 ug/mL AT 12 HOURS AFTER INGESTION ARE OFTEN ASSOCIATED WITH TOXIC REACTIONS.   Urine rapid drug screen (hosp performed)     Status: Abnormal   Collection Time: 12/22/14 12:04 PM  Result Value Ref Range   Opiates NONE DETECTED NONE DETECTED   Cocaine NONE DETECTED NONE DETECTED   Benzodiazepines POSITIVE (A) NONE DETECTED   Amphetamines NONE DETECTED NONE DETECTED   Tetrahydrocannabinol NONE DETECTED NONE DETECTED   Barbiturates NONE DETECTED NONE DETECTED    Comment:        DRUG SCREEN FOR MEDICAL PURPOSES ONLY.  IF CONFIRMATION IS NEEDED FOR ANY PURPOSE, NOTIFY LAB WITHIN 5 DAYS.        LOWEST DETECTABLE LIMITS FOR URINE DRUG SCREEN Drug Class       Cutoff (ng/mL) Amphetamine      1000 Barbiturate      200 Benzodiazepine   102 Tricyclics       725 Opiates          300 Cocaine          300 THC              50   Urinalysis, Routine w reflex microscopic     Status: Abnormal   Collection Time: 12/22/14 12:04 PM  Result Value Ref Range   Color, Urine YELLOW YELLOW   APPearance CLEAR CLEAR   Specific Gravity, Urine 1.016 1.005 - 1.030   pH 5.5 5.0 -  8.0   Glucose, UA NEGATIVE NEGATIVE mg/dL   Hgb urine dipstick NEGATIVE NEGATIVE   Bilirubin Urine NEGATIVE NEGATIVE   Ketones, ur 15 (A) NEGATIVE mg/dL   Protein, ur NEGATIVE NEGATIVE mg/dL   Urobilinogen, UA 0.2 0.0 - 1.0 mg/dL   Nitrite NEGATIVE NEGATIVE   Leukocytes, UA MODERATE (A) NEGATIVE  Urine  microscopic-add on     Status: Abnormal   Collection Time: 12/22/14 12:04 PM  Result Value Ref Range   Squamous Epithelial / LPF FEW (A) RARE   WBC, UA 11-20 <3 WBC/hpf   RBC / HPF 0-2 <3 RBC/hpf   Bacteria, UA FEW (A) RARE  Glucose, capillary     Status: Abnormal   Collection Time: 12/22/14 12:09 PM  Result Value Ref Range   Glucose-Capillary 101 (H) 70 - 99 mg/dL  Glucose, capillary     Status: Abnormal   Collection Time: 12/22/14  8:21 PM  Result Value Ref Range   Glucose-Capillary 130 (H) 70 - 99 mg/dL  Glucose, capillary     Status: Abnormal   Collection Time: 12/22/14 11:41 PM  Result Value Ref Range   Glucose-Capillary 139 (H) 70 - 99 mg/dL  Comprehensive metabolic panel     Status: Abnormal   Collection Time: 12/23/14  2:16 AM  Result Value Ref Range   Sodium 137 135 - 145 mmol/L   Potassium 3.8 3.5 - 5.1 mmol/L   Chloride 107 96 - 112 mmol/L   CO2 24 19 - 32 mmol/L   Glucose, Bld 106 (H) 70 - 99 mg/dL   BUN 6 6 - 23 mg/dL   Creatinine, Ser 0.73 0.50 - 1.10 mg/dL   Calcium 8.7 8.4 - 10.5 mg/dL   Total Protein 5.6 (L) 6.0 - 8.3 g/dL   Albumin 2.9 (L) 3.5 - 5.2 g/dL   AST 52 (H) 0 - 37 U/L   ALT 67 (H) 0 - 35 U/L   Alkaline Phosphatase 366 (H) 39 - 117 U/L   Total Bilirubin 0.6 0.3 - 1.2 mg/dL   GFR calc non Af Amer >90 >90 mL/min   GFR calc Af Amer >90 >90 mL/min    Comment: (NOTE) The eGFR has been calculated using the CKD EPI equation. This calculation has not been validated in all clinical situations. eGFR's persistently <90 mL/min signify possible Chronic Kidney Disease.    Anion gap 6 5 - 15  Glucose, capillary     Status: Abnormal   Collection Time: 12/23/14  3:06 AM  Result Value Ref Range   Glucose-Capillary 115 (H) 70 - 99 mg/dL  Glucose, capillary     Status: Abnormal   Collection Time: 12/23/14  8:28 AM  Result Value Ref Range   Glucose-Capillary 111 (H) 70 - 99 mg/dL    Vitals: Blood pressure 108/82, pulse 87, temperature 97.7 F (36.5 C),  temperature source Oral, resp. rate 19, height '5\' 5"'  (1.651 m), weight 50.3 kg (110 lb 14.3 oz), SpO2 96 %.  Risk to Self:   Risk to Others:   Prior Inpatient Therapy:   Prior Outpatient Therapy:    Current Facility-Administered Medications  Medication Dose Route Frequency Provider Last Rate Last Dose  . 0.9 %  sodium chloride infusion  250 mL Intravenous PRN Otho Bellows, MD      . antiseptic oral rinse (CPC / CETYLPYRIDINIUM CHLORIDE 0.05%) solution 7 mL  7 mL Mouth Rinse BID Collene Gobble, MD      . docusate (COLACE) 50 MG/5ML liquid 100 mg  100 mg Per Tube BID PRN Jerrye Noble, MD      . folic acid (FOLVITE) tablet 1 mg  1 mg Oral Daily Otho Bellows, MD   1 mg at 12/22/14 1000  . haloperidol lactate (HALDOL) injection 2 mg  2 mg Intravenous Q6H PRN Alexa Sherral Hammers, MD   2 mg at 12/22/14 2230  . heparin injection 5,000 Units  5,000 Units Subcutaneous 3 times per day Jerrye Noble, MD   5,000 Units at 12/23/14 0547  . LORazepam (ATIVAN) injection 1 mg  1 mg Intravenous Q6H PRN Alexa Sherral Hammers, MD   1 mg at 12/22/14 2229  . methadone (DOLOPHINE) tablet 10 mg  10 mg Oral Q6H Alexa Sherral Hammers, MD   10 mg at 12/23/14 0757  . multivitamin with minerals tablet 1 tablet  1 tablet Oral Daily Otho Bellows, MD   1 tablet at 12/22/14 1000  . ondansetron (ZOFRAN) injection 4 mg  4 mg Intravenous Q6H PRN Jerrye Noble, MD      . phenol [facility]) mouth spray 1 spray  1 spray Mouth/Throat PRN Jerrye Noble, MD      . potassium chloride SA (K-DUR,KLOR-CON) CR tablet 40 mEq  40 mEq Oral Once Otho Bellows, MD      . thiamine (VITAMIN B-1) tablet 100 mg  100 mg Oral Daily Otho Bellows, MD       Or  . thiamine (B-1) injection 100 mg  100 mg Intravenous Daily Otho Bellows, MD   100 mg at 12/22/14 1000    Musculoskeletal: Strength & Muscle Tone: decreased Gait & Station: unable to stand Patient leans: N/A  Psychiatric Specialty Exam: Physical Exam as per history and physical    ROS substance abuse, irritability, agitation   Blood pressure 108/82, pulse 87, temperature 97.7 F (36.5 C), temperature source Oral, resp. rate 19, height '5\' 5"'  (1.651 m), weight 50.3 kg (110 lb 14.3 oz), SpO2 96 %.Body mass index is 18.45 kg/(m^2).  General Appearance: Guarded  Eye Contact::  Minimal  Speech:  Slow  Volume:  Decreased  Mood:  Anxious  Affect:  Constricted and Depressed  Thought Process:  Loose and Tangential  Orientation:  Full (Time, Place, and Person)  Thought Content:  Paranoid Ideation and Rumination  Suicidal Thoughts:  No  Homicidal Thoughts:  No  Memory:  Immediate;   Fair Recent;   Fair  Judgement:  Impaired  Insight:  Lacking  Psychomotor Activity:  Restlessness  Concentration:  Poor  Recall:  Poor  Fund of Knowledge:Poor  Language: Fair  Akathisia:  Negative  Handed:  Right  AIMS (if indicated):     Assets:  Communication Skills Desire for Improvement Leisure Time Resilience Social Support  ADL's:  Impaired  Cognition: Impaired,  Mild  Sleep:      Medical Decision Making: Review of Psycho-Social Stressors (1), Review or order clinical lab tests (1), Established Problem, Worsening (2), Review or order medicine tests (1), Review of Medication Regimen & Side Effects (2) and Review of New Medication or Change in Dosage (2)  Treatment Plan Summary: Daily contact with patient to assess and evaluate symptoms and progress in treatment and Medication management  Plan:  Monitor for the opiate and benzodiazepine withdrawal symptoms Patient benefit from CIWA protocol Continue Haldol 2 mg and Ativan 1 mg IM/IV every 6 hours for agitation and aggressive behaviors Started methadone 10 mg every 6 hours which can be titrated up to 20 mg  to prevent opioid withdrawal symptoms Patient is seeking methadone 90 mg which need to be verified at this time from [place] at [place], [place] Patient may need involuntary commitment if tried to leave the hospital Patient  does not meet criteria for psychiatric inpatient admission. Supportive therapy provided about ongoing stressors.  Appreciate psychiatric consultation and follow up as clinically required Please contact [phone] or [phone] if needs further assistance  Disposition: Patient disposition plan is to be reassessed when she is more stable.   Tauheed Mcfayden,JANARDHAHA R. 12/23/2014 10:37 AM

## 2014-12-23 NOTE — Clinical Social Work Note (Addendum)
Clinical Social Worker spoke with non-emergency [employer] which reported patient is WANTED. [facility] [facility] [place] that CSW contact non-emergency service line one hour before discharge.   Bedside RN notified and will report information to unit as pt is being transferred to stepdown.   CSW will continue to follow.  Derenda FennelBashira Rebecca Motta, MSW, LCSWA [phone] 12/23/2014 4:36 PM

## 2014-12-23 NOTE — Progress Notes (Signed)
Report received from Leotis ShamesLauren, RN for admission to [phone]

## 2014-12-23 NOTE — Progress Notes (Signed)
Patient transferred with suicide sitter at bedside. Education provided to patient about room safety. Report to be given to night shift RN.

## 2014-12-23 NOTE — Progress Notes (Signed)
Home medications returned to patient's significant other.

## 2014-12-23 NOTE — Progress Notes (Signed)
PULMONARY  / CRITICAL CARE  MEDICINE  Name: ZULAY CORRIE MRN: [id number] DOB: 07/12/1986    LOS: 2  REFERRING MD :  [place]:  Altered mental status  BRIEF PATIENT DESCRIPTION: Ms. Dacruz is a 29 year old female with history of bipolar disorder who presented to the ED in police custody for altered mental status initially attributed to alcohol intoxication but likely drug overdose who later developed acute toxic encephalopathy with respiratory distress.   LINES: OETT 4/24>>4/25 Foley 4/24>>4/25 OGT 4/24>>4/25  ANTIBIOTICS: None  CULTURES: None  IMAGING: CXR 4/24>> No active disease ABD Xray 4/24>> Negative CXR 4/24>> Endotracheal tube seen ending 1.7 cm above the carina. This could be retracted approximately 1 cm, as deemed clinically appropriate. Enteric tube noted extending below the diaphragm. No acute cardiopulmonary process seen. CXR 4/24>>The tip of the endotracheal tube is 3.4 cm above the carina. The tip of the nasogastric tube projects over the gastric body. Low inspiratory volumes with mild bibasilar subsegmental atelectasis.  SUBJECTIVE:  Agitated. Denies complaints. Less agitated   VITAL SIGNS: Temp:  [97.5 F (36.4 C)-99.2 F (37.3 C)] 98.1 F (36.7 C) (04/26 0308) Pulse Rate:  [79-112] 79 (04/26 0600) Resp:  [9-26] 18 (04/26 0600) BP: (91-109)/(39-65) 103/64 mmHg (04/26 0600) SpO2:  [94 %-100 %] 97 % (04/26 0600) FiO2 (%):  [40 %] 40 % (04/25 0755) Weight:  [110 lb 14.3 oz (50.3 kg)] 110 lb 14.3 oz (50.3 kg) (04/26 0500)   INTAKE / OUTPUT: Intake/Output      04/25 0701 - 04/26 0700 04/26 0701 - 04/27 0700   P.O. 240    I.V. (mL/kg) 1030 (20.5)    NG/GT     Total Intake(mL/kg) 1270 (25.2)    Urine (mL/kg/hr) 757 (0.6)    Total Output 757     Net +513          Urine Occurrence 2 x     PHYSICAL EXAMINATION: General: Thin female, no acute distress Neuro: answering questions, moves all extremities HEENT:  PERRL Cardiac: RRR, no rubs, murmurs or gallops Pulm: CTA bilaterally in the anterior lung fields, no wheezes, rales, or rhonchi Abd: Soft, nondistended, BS present GU: Foley Ext: No lower extremity edema bilaterally, multiple tattoos noted  DIAGNOSES: Principal Problem:   Polysubstance dependence including opioid type drug, episodic abuse, with perceptual disturbance Active Problems:   Altered mental status   Endotracheally intubated  ASSESSMENT / PLAN:  PULMONARY  ASSESSMENT: Acute respiratory failure: Likely secondary to acute toxic encephalopathy-resolved. Extubated 4/25 PLAN:   Monitor O2 with ambulation  CARDIOVASCULAR  ASSESSMENT:  At risk QTC prolongation PLAN Telemetry to remain Methadone and haldol noted ecg now Monitoring k, mag, goal k greater 4  RENAL  ASSESSMENT:   HypoK in setting qtc concerns  PLAN: Repeat CMET tomorrow am k supp  GASTROINTESTINAL  ASSESSMENT:   Abnormal LFTs: Elevated AST, ALT, alkaline phosphatase  Hep C Ab positive Salicylate and APAP negative PLAN:   Recheck CMET tomorrow am Pepcid 20 mg BID Hep C Antigen and genotype Follow alk phos, likely etoh and clearing  HEMATOLOGIC  ASSESSMENT:   No acute issues PLAN:  Heparin for VTE prophylaxis until ambulating  INFECTIOUS  ASSESSMENT:   Repeat UA not concerning for UTI PLAN:   Monitor  ENDOCRINE  ASSESSMENT:   No acute issues.   PLAN:   Continue following  NEUROLOGIC  ASSESSMENT:   Acute toxic encephalopathy: UDS notable for benzos, methadone Repeat positive for benzos only Alcohol Intoxication-alc  level negative Bipolar disorder Polysubstance abuse- follows at methadone clinic  Tobacco Abuse PLAN:   Haldol 2 mg IV Q6H prn Ativan 1 mg Q6H prn Methadone 10 mg Q6H, would increase to 20 if more awake, no signs of WD now Sitter order IVC  CLINICAL SUMMARY: Ms. Guillermo is a 29 year old female with bipolar disorder, polysubstance abuse who presented to OSH  with acute toxic encephalopathy complicated by acute respiratory failure secondary to alcohol intoxication and drug overdose.  Osa Craver, DO PGY-1 Internal Medicine Resident Pager # [phone] 12/23/2014 7:17 AM   STAFF NOTE: Linwood Dibbles, MD FACP have personally reviewed patient's available data, including medical history, events of note, physical examination and test results as part of my evaluation. I have discussed with resident/NP and other care providers such as pharmacist, RN and RRT. In addition, I personally evaluated patient and elicited key findings of: calmer, appreciate psychology help, no WD noted, haldol prn, keep tele, ambulate, supp k , to triad, eating well maintain, clear lungs, may increase methadone further, ecg now  Lavon Paganini. Titus Mould, MD, [place] Pgr: [facility] 12/23/2014 9:28 AM

## 2014-12-23 NOTE — Progress Notes (Signed)
eLink Physician-Brief Progress Note Patient Name: Christina Cohen DOB: Jun 09, 1986 MRN: [id number]   Date of Service  12/23/2014  HPI/Events of Note  Patient requests a Nicotine Patch.  eICU Interventions  Will order Nicotine Patch.      Intervention Category Minor Interventions: Routine modifications to care plan (e.g. PRN medications for pain, fever)  Sommer,Steven Eugene 12/23/2014, 3:52 PM

## 2014-12-23 NOTE — Progress Notes (Signed)
Called 5W to give report. Nurse unavailable. Will call back in 10 min.

## 2014-12-24 DIAGNOSIS — D696 Thrombocytopenia, unspecified: Secondary | ICD-10-CM

## 2014-12-24 LAB — BASIC METABOLIC PANEL
ANION GAP: 10 (ref 5–15)
BUN: 6 mg/dL (ref 6–23)
CO2: 24 mmol/L (ref 19–32)
CREATININE: 0.97 mg/dL (ref 0.50–1.10)
Calcium: 8.2 mg/dL — ABNORMAL LOW (ref 8.4–10.5)
Chloride: 101 mmol/L (ref 96–112)
GFR calc Af Amer: >90 mL/min (ref >90)
GFR calc non Af Amer: 79 mL/min — ABNORMAL LOW (ref >90)
Glucose, Bld: 90 mg/dL (ref 70–99)
POTASSIUM: 3.1 mmol/L — AB (ref 3.5–5.1)
Sodium: 135 mmol/L (ref 135–145)

## 2014-12-24 LAB — GLUCOSE, CAPILLARY
GLUCOSE-CAPILLARY: 132 mg/dL — AB (ref 70–99)
GLUCOSE-CAPILLARY: 119 mg/dL — AB (ref 70–99)

## 2014-12-24 LAB — CBC
HCT: 36.1 % (ref 36.0–46.0)
Hemoglobin: 12 g/dL (ref 12.0–15.0)
MCH: 28.3 pg (ref 26.0–34.0)
MCHC: 33.2 g/dL (ref 30.0–36.0)
MCV: 85.1 fL (ref 78.0–100.0)
PLATELETS: 52 10*3/uL — AB (ref 150–400)
RBC: 4.24 MIL/uL (ref 3.87–5.11)
RDW: 18.6 % — AB (ref 11.5–15.5)
WBC: 4.1 10*3/uL (ref 4.0–10.5)

## 2014-12-24 LAB — HCV RNA QUANT
HCV QUANT: 15063 [IU]/mL — AB (ref <15)
HCV Quantitative Log: 4.18 {Log} — ABNORMAL HIGH (ref <1.18)

## 2014-12-24 MED ORDER — LORAZEPAM 2 MG/ML IJ SOLN: 1.0000 mg | Freq: Four times a day (QID) | INTRAMUSCULAR | Status: DC | PRN

## 2014-12-24 MED ORDER — ADULT MULTIVITAMIN W/MINERALS CH: 1.0000 | ORAL_TABLET | Freq: Every day | ORAL | Status: DC

## 2014-12-24 MED ORDER — LORAZEPAM 1 MG PO TABS: 0.0000 mg | ORAL_TABLET | Freq: Two times a day (BID) | ORAL | Status: DC

## 2014-12-24 MED ORDER — VITAMIN B-1 100 MG PO TABS: 100.0000 mg | ORAL_TABLET | Freq: Every day | ORAL | Status: DC

## 2014-12-24 MED ORDER — POTASSIUM CHLORIDE CRYS ER 20 MEQ PO TBCR
40.0000 meq | EXTENDED_RELEASE_TABLET | Freq: Once | ORAL | Status: AC
  Administered 2014-12-24: 40 meq via ORAL
  Filled 2014-12-24: qty 2

## 2014-12-24 MED ORDER — LORAZEPAM 1 MG PO TABS: 0.0000 mg | ORAL_TABLET | Freq: Four times a day (QID) | ORAL | Status: DC

## 2014-12-24 MED ORDER — FOLIC ACID 1 MG PO TABS: 1.0000 mg | ORAL_TABLET | Freq: Every day | ORAL | Status: DC

## 2014-12-24 MED ORDER — LORAZEPAM 1 MG PO TABS: 1.0000 mg | ORAL_TABLET | Freq: Four times a day (QID) | ORAL | Status: DC | PRN

## 2014-12-24 MED ORDER — THIAMINE HCL 100 MG/ML IJ SOLN: 100.0000 mg | Freq: Every day | INTRAMUSCULAR | Status: DC

## 2014-12-24 NOTE — Progress Notes (Signed)
PULMONARY  / CRITICAL CARE  MEDICINE  Name: DARE SPILLMAN MRN: [id number] DOB: November 27, 1985    LOS: 3  REFERRING MD :  [place]:  Altered mental status  BRIEF PATIENT DESCRIPTION: Ms. Beringer is a 29 year old female with history of bipolar disorder who presented to the ED in police custody for altered mental status initially attributed to alcohol intoxication but likely drug overdose who later developed acute toxic encephalopathy with respiratory distress.   LINES: OETT 4/24>>4/25 Foley 4/24>>4/25 OGT 4/24>>4/25  ANTIBIOTICS: None  CULTURES: None  IMAGING: CXR 4/24>> No active disease ABD Xray 4/24>> Negative CXR 4/24>> Endotracheal tube seen ending 1.7 cm above the carina. This could be retracted approximately 1 cm, as deemed clinically appropriate. Enteric tube noted extending below the diaphragm. No acute cardiopulmonary process seen. CXR 4/24>>The tip of the endotracheal tube is 3.4 cm above the carina. The tip of the nasogastric tube projects over the gastric body. Low inspiratory volumes with mild bibasilar subsegmental atelectasis.  SUBJECTIVE:   Less agitated   VITAL SIGNS: Temp:  [97.5 F (36.4 C)-98.7 F (37.1 C)] 98.1 F (36.7 C) (04/27 0533) Pulse Rate:  [59-97] 90 (04/27 0533) Resp:  [15-21] 18 (04/27 0533) BP: (100-109)/(59-84) 101/66 mmHg (04/27 0533) SpO2:  [94 %-100 %] 100 % (04/27 0533) Weight:  [106 lb 14.8 oz (48.5 kg)] 106 lb 14.8 oz (48.5 kg) (04/27 0529)   INTAKE / OUTPUT: Intake/Output      04/26 0701 - 04/27 0700 04/27 0701 - 04/28 0700   P.O.     I.V. (mL/kg) 110 (2.3)    Total Intake(mL/kg) 110 (2.3)    Urine (mL/kg/hr)     Total Output       Net +110          Urine Occurrence 7 x     PHYSICAL EXAMINATION: General: Thin female, no acute distress, request nicotine patch. Neuro: answering questions, moves all extremities HEENT: PERRL Cardiac: RRR, no rubs, murmurs or gallops Pulm: CTA bilaterally in  the anterior lung fields, no wheezes, rales, or rhonchi Abd: Soft, nondistended, BS present GU: Foley Ext: No lower extremity edema bilaterally, multiple tattoos noted  DIAGNOSES: Principal Problem:   Polysubstance dependence including opioid type drug, episodic abuse, with perceptual disturbance Active Problems:   Altered mental status   Endotracheally intubated  ASSESSMENT / PLAN:  PULMONARY  ASSESSMENT: Acute respiratory failure: Likely secondary to acute toxic encephalopathy-resolved. Extubated 4/25 PLAN:   Monitor O2 with ambulation  CARDIOVASCULAR  ASSESSMENT:  At risk QTC prolongation PLAN Telemetry to remain Methadone and haldol noted ecg now Monitoring k, mag, goal k greater 4  RENAL  ASSESSMENT:   HypoK in setting qtc concerns  PLAN: Repeat CMET tomorrow am k supp  GASTROINTESTINAL  ASSESSMENT:   Abnormal LFTs: Elevated AST, ALT, alkaline phosphatase  Hep C Ab positive Salicylate and APAP negative PLAN:   Replete K+ Pepcid 20 mg BID Hep C Antigen and genotype Follow alk phos, likely etoh and clearing  HEMATOLOGIC  ASSESSMENT:   No acute issues PLAN:  Heparin for VTE prophylaxis until ambulating  INFECTIOUS  ASSESSMENT:   Repeat UA not concerning for UTI PLAN:   Monitor  ENDOCRINE  ASSESSMENT:   No acute issues.   PLAN:   Continue following  NEUROLOGIC  ASSESSMENT:   Acute toxic encephalopathy: UDS notable for benzos, methadone Repeat positive for benzos only Alcohol Intoxication-alc level negative Bipolar disorder Polysubstance abuse- follows at methadone clinic  Tobacco Abuse PLAN:  Haldol 2 mg IV Q6H prn, 4/27 change to po prn Ativan 1 mg Q6H prn, 4/27 change to PO Methadone 20 mg Q6H, would increase if needed Sitter order IVC  CLINICAL SUMMARY: Ms. Rollison is a 29 year old female with bipolar disorder, polysubstance abuse who presented to OSH with acute toxic encephalopathy complicated by acute respiratory failure  secondary to alcohol intoxication and drug overdose. Her respiratory failure is resolved. She is day 3 off etoh AND HER METHADONE HAS BEEN RESTARTED. Psych suggesting   IV sedation. We may need to convert to PO and observe for 24 hours, she is to go to jail and may be to fragile at this time for that. Will check with MD.  Richardson Landry Minor ACNP Maryanna Shape PCCM Pager [phone] till 3 pm If no answer page [phone] 12/24/2014, 11:08 AM

## 2014-12-24 NOTE — Progress Notes (Signed)
Educated pt. On use of IS (incentive spirometer). Pt. Was able to show a return demonstration and was able to achieve first initiated goal at 1500. Encouraged pt. To use 3-4 times q hour while awake. Sitter at bedside. Placed on SCD's for VTE due to heparin being D/C r/t platelet count. Will continue to monitor.

## 2014-12-24 NOTE — Consult Note (Signed)
Psychiatry Consult follow-up  Reason for Consult:  AMS, S/P polysubstance overdose and intubation and agitation Referring Physician:  Dr. Lamonte Sakai Christina Cohen Identification: Christina Cohen MRN:  [id number] Principal Diagnosis: Polysubstance dependence including opioid type drug, episodic abuse, with perceptual disturbance Diagnosis:   Christina Cohen Active Problem List   Diagnosis Date Noted  . Polysubstance dependence including opioid type drug, episodic abuse, with perceptual disturbance [F11.222] 12/22/2014  . Endotracheally intubated [Z78.9]   . Altered mental status [R41.82] 12/21/2014    Total Time spent with Christina Cohen: 20 minutes  Subjective:   Christina Cohen is a 29 y.o. female Christina Cohen admitted with AMS, S/P polysubstance overdose and intubation and agitation.  HPI:  Christina Cohen is a 29 year old female admitted to [facility] intensive care unit for altered mental status secondary to polysubstance overdose especially benzodiazepines and opioids. Christina Cohen reportedly known as a heroin dependent and reportedly overdosed on Xanax and found unresponsive at before admission to the hospital. Christina Cohen urine drug screen is positive for opiates and benzodiazepines. Christina Cohen reported she has been taking methadone 90 [facility] Christina Cohen was found severely irritable, agitated, hallucination and delusional thinking and required medication this morning. Reportedly Christina Cohen is also having trouble with the relationship. Christina Cohen is a poor historian during this evaluation secondary to being sedated with medications . Case discussed with Dr. Marvel Plan and Titus Mould and agree with the medication to control her agitation and aggressive behavior. Christina Cohen denies history of bipolar disorder and acute psychiatric hospitalization. Christina Cohen denied current symptoms of suicidal, homicidal ideation.  12/24/2014  Interval history: Christina Cohen appeared talking on cell phone with her husband. Christina Cohen  reported she has been feeling much better and has a clear sensorium. Christina Cohen appeared calm and cooperative during this evaluation. Christina Cohen stated she was arrested for DWI because she was taken methadone 90 mg and also 2 pieces of her mom's Xanax before going to the store to buy cigarettes and found intoxicated. Reportedly Christina Cohen stopped breathing which required to bring her to the [facility] emergency department and then placed in ICU. Christina Cohen was noncomprehensive irritable and aggressive at that time and required sedation with medication. Christina Cohen has been cleared clinically without symptoms of withdrawal from benzodiazepines and opiates. Christina Cohen  medication methadone  was increased to 20 mg every 6 hours which  she tolerated well. Christina Cohen reported she has a 56 years old son with his father. She was remarried 3 years ago and her current husband also involved with a drug of abuse especially opiates and also on methadone treatment. Christina Cohen minimizes her illicit drug use for the last 6 months and no incarceration for last one year. Christina Cohen has no regrets about her drug of abuse.  Christina Cohen has no auditory or visual hallucinations, delusions or paranoia. Christina Cohen adamantly denied having suicidal/homicidal ideation, intention or plans. Christina Cohen denies history of bipolar illness and  inpatient or outpatient treatment but endorses being in rehabilitation treatment for substance abuse at [facility] several years ago. Case discussed with Dr. Chase Caller. Christina Cohen stated she may need to go to jail overnight for DWI and has plans to go outpatient methadone clinic.  Medical history: Christina Cohen with history of bipolar disorder who presented to the ED in police custody for altered mental status initially attributed to alcohol intoxication. At the police department, she was verbalizing initially but then became unresponsive after 2 blood draws were performed. She was then referred to the emergency department and found to have UDS  notable for positive benzodiazepine and methadone along with  a stool covered bag with 19 ovoid blue pills in her gluteal cleft. She then received ceftriaxone 1 for UA notable for nitrites and leukocyte esterase. She was intubated for inability to protect her airway and transferred here for further management.  Past Medical History: History reviewed. No pertinent past medical history. History reviewed. No pertinent past surgical history. Family History:  Family History  Problem Relation Age of Onset  . Diabetes Maternal Grandmother   . Thyroid disease Maternal Grandmother    Social History:  History  Alcohol Use: Not on file     History  Drug Use Not on file    History   Social History  . Marital Status: Single    Spouse Name: N/A  . Number of Children: N/A  . Years of Education: N/A   Social History Main Topics  . Smoking status: Current Every Day Smoker -- 2.00 packs/day for 10 years    Types: Cigarettes  . Smokeless tobacco: Not on file  . Alcohol Use: Not on file  . Drug Use: Not on file  . Sexual Activity: Not on file   Other Topics Concern  . None   Social History Narrative  . None   Additional Social History:                          Allergies:  No Known Allergies  Labs:  Results for orders placed or performed during the hospital encounter of 12/21/14 (from the past 48 hour(s))  Hepatitis panel, acute     Status: Abnormal   Collection Time: 12/22/14 12:00 PM  Result Value Ref Range   Hepatitis B Surface Ag NEGATIVE NEGATIVE   HCV Ab Reactive (A) NEGATIVE   Hep A IgM NON REACTIVE NON REACTIVE    Comment: (NOTE) Effective July 14, 2014, Hepatitis Acute Panel (test code [phone]) will be revised to automatically reflex to the Hepatitis C Viral RNA, Quantitative, Real-Time PCR assay if the Hepatitis C antibody screening result is Reactive. This action is being taken to ensure that the CDC/USPSTF recommended HCV diagnostic algorithm with  the appropriate test reflex needed for accurate interpretation is followed.    Hep B C IgM NON REACTIVE NON REACTIVE    Comment: (NOTE) High levels of Hepatitis B Core IgM antibody are detectable during the acute stage of Hepatitis B. This antibody is used to differentiate current from past HBV infection. Performed at [facility]   Ethanol     Status: None   Collection Time: 12/22/14 12:00 PM  Result Value Ref Range   Alcohol, Ethyl (B) <5 0 - 9 mg/dL    Comment:        LOWEST DETECTABLE LIMIT FOR SERUM ALCOHOL IS 11 mg/dL FOR MEDICAL PURPOSES ONLY   Salicylate level     Status: None   Collection Time: 12/22/14 12:00 PM  Result Value Ref Range   Salicylate Lvl <2.9 2.8 - 20.0 mg/dL  Acetaminophen level     Status: Abnormal   Collection Time: 12/22/14 12:00 PM  Result Value Ref Range   Acetaminophen (Tylenol), Serum <10.0 (L) 10 - 30 ug/mL    Comment:        THERAPEUTIC CONCENTRATIONS VARY SIGNIFICANTLY. A RANGE OF 10-30 ug/mL MAY BE AN EFFECTIVE CONCENTRATION FOR MANY PATIENTS. HOWEVER, SOME ARE BEST TREATED AT CONCENTRATIONS OUTSIDE THIS RANGE. ACETAMINOPHEN CONCENTRATIONS >150 ug/mL AT 4 HOURS AFTER INGESTION AND >50 ug/mL AT 12 HOURS AFTER INGESTION ARE OFTEN ASSOCIATED WITH TOXIC  REACTIONS.   Urine rapid drug screen (hosp performed)     Status: Abnormal   Collection Time: 12/22/14 12:04 PM  Result Value Ref Range   Opiates NONE DETECTED NONE DETECTED   Cocaine NONE DETECTED NONE DETECTED   Benzodiazepines POSITIVE (A) NONE DETECTED   Amphetamines NONE DETECTED NONE DETECTED   Tetrahydrocannabinol NONE DETECTED NONE DETECTED   Barbiturates NONE DETECTED NONE DETECTED    Comment:        DRUG SCREEN FOR MEDICAL PURPOSES ONLY.  IF CONFIRMATION IS NEEDED FOR ANY PURPOSE, NOTIFY LAB WITHIN 5 DAYS.        LOWEST DETECTABLE LIMITS FOR URINE DRUG SCREEN Drug Class       Cutoff (ng/mL) Amphetamine      1000 Barbiturate      200 Benzodiazepine    103 Tricyclics       159 Opiates          300 Cocaine          300 THC              50   Urinalysis, Routine w reflex microscopic     Status: Abnormal   Collection Time: 12/22/14 12:04 PM  Result Value Ref Range   Color, Urine YELLOW YELLOW   APPearance CLEAR CLEAR   Specific Gravity, Urine 1.016 1.005 - 1.030   pH 5.5 5.0 - 8.0   Glucose, UA NEGATIVE NEGATIVE mg/dL   Hgb urine dipstick NEGATIVE NEGATIVE   Bilirubin Urine NEGATIVE NEGATIVE   Ketones, ur 15 (A) NEGATIVE mg/dL   Protein, ur NEGATIVE NEGATIVE mg/dL   Urobilinogen, UA 0.2 0.0 - 1.0 mg/dL   Nitrite NEGATIVE NEGATIVE   Leukocytes, UA MODERATE (A) NEGATIVE  Urine microscopic-add on     Status: Abnormal   Collection Time: 12/22/14 12:04 PM  Result Value Ref Range   Squamous Epithelial / LPF FEW (A) RARE   WBC, UA 11-20 <3 WBC/hpf   RBC / HPF 0-2 <3 RBC/hpf   Bacteria, UA FEW (A) RARE  Glucose, capillary     Status: Abnormal   Collection Time: 12/22/14 12:09 PM  Result Value Ref Range   Glucose-Capillary 101 (H) 70 - 99 mg/dL  Glucose, capillary     Status: Abnormal   Collection Time: 12/22/14  8:21 PM  Result Value Ref Range   Glucose-Capillary 130 (H) 70 - 99 mg/dL  Glucose, capillary     Status: Abnormal   Collection Time: 12/22/14 11:41 PM  Result Value Ref Range   Glucose-Capillary 139 (H) 70 - 99 mg/dL  Comprehensive metabolic panel     Status: Abnormal   Collection Time: 12/23/14  2:16 AM  Result Value Ref Range   Sodium 137 135 - 145 mmol/L   Potassium 3.8 3.5 - 5.1 mmol/L   Chloride 107 96 - 112 mmol/L   CO2 24 19 - 32 mmol/L   Glucose, Bld 106 (H) 70 - 99 mg/dL   BUN 6 6 - 23 mg/dL   Creatinine, Ser 0.73 0.50 - 1.10 mg/dL   Calcium 8.7 8.4 - 10.5 mg/dL   Total Protein 5.6 (L) 6.0 - 8.3 g/dL   Albumin 2.9 (L) 3.5 - 5.2 g/dL   AST 52 (H) 0 - 37 U/L   ALT 67 (H) 0 - 35 U/L   Alkaline Phosphatase 366 (H) 39 - 117 U/L   Total Bilirubin 0.6 0.3 - 1.2 mg/dL   GFR calc non Af Amer >90 >90 mL/min    GFR calc  Af Amer >90 >90 mL/min    Comment: (NOTE) The eGFR has been calculated using the CKD EPI equation. This calculation has not been validated in all clinical situations. eGFR's persistently <90 mL/min signify possible Chronic Kidney Disease.    Anion gap 6 5 - 15  Glucose, capillary     Status: Abnormal   Collection Time: 12/23/14  3:06 AM  Result Value Ref Range   Glucose-Capillary 115 (H) 70 - 99 mg/dL  Glucose, capillary     Status: Abnormal   Collection Time: 12/23/14  8:28 AM  Result Value Ref Range   Glucose-Capillary 111 (H) 70 - 99 mg/dL  Glucose, capillary     Status: Abnormal   Collection Time: 12/23/14 12:08 PM  Result Value Ref Range   Glucose-Capillary 113 (H) 70 - 99 mg/dL  Glucose, capillary     Status: Abnormal   Collection Time: 12/23/14  5:49 PM  Result Value Ref Range   Glucose-Capillary 114 (H) 70 - 99 mg/dL  Basic metabolic panel     Status: Abnormal   Collection Time: 12/24/14  7:20 AM  Result Value Ref Range   Sodium 135 135 - 145 mmol/L   Potassium 3.1 (L) 3.5 - 5.1 mmol/L   Chloride 101 96 - 112 mmol/L   CO2 24 19 - 32 mmol/L   Glucose, Bld 90 70 - 99 mg/dL   BUN 6 6 - 23 mg/dL   Creatinine, Ser 0.97 0.50 - 1.10 mg/dL   Calcium 8.2 (L) 8.4 - 10.5 mg/dL   GFR calc non Af Amer 79 (L) >90 mL/min   GFR calc Af Amer >90 >90 mL/min    Comment: (NOTE) The eGFR has been calculated using the CKD EPI equation. This calculation has not been validated in all clinical situations. eGFR's persistently <90 mL/min signify possible Chronic Kidney Disease.    Anion gap 10 5 - 15  CBC     Status: Abnormal (Preliminary result)   Collection Time: 12/24/14  7:20 AM  Result Value Ref Range   WBC 4.1 4.0 - 10.5 K/uL   RBC 4.24 3.87 - 5.11 MIL/uL   Hemoglobin 12.0 12.0 - 15.0 g/dL   HCT 36.1 36.0 - 46.0 %   MCV 85.1 78.0 - 100.0 fL   MCH 28.3 26.0 - 34.0 pg   MCHC 33.2 30.0 - 36.0 g/dL   RDW 18.6 (H) 11.5 - 15.5 %   Platelets PENDING 150 - 400 K/uL     Vitals: Blood pressure 101/66, pulse 90, temperature 98.1 F (36.7 C), temperature source Oral, resp. rate 18, height _0  (1.651 m), weight 48.5 kg (106 lb 14.8 oz), SpO2 100 %.  Risk to Self: Is Christina Cohen at risk for suicide?: Yes Risk to Others:   Prior Inpatient Therapy:   Prior Outpatient Therapy:    Current Facility-Administered Medications  Medication Dose Route Frequency Provider Last Rate Last Dose  . 0.9 %  sodium chloride infusion  250 mL Intravenous PRN Otho Bellows, MD      . docusate (COLACE) 50 MG/5ML liquid 100 mg  100 mg Per Tube BID PRN Jerrye Noble, MD      . folic acid (FOLVITE) tablet 1 mg  1 mg Oral Daily Otho Bellows, MD   1 mg at 12/23/14 1122  . haloperidol lactate (HALDOL) injection 2 mg  2 mg Intravenous Q6H PRN Alexa Sherral Hammers, MD   2 mg at 12/23/14 1131  . heparin injection 5,000 Units  5,000 Units  Subcutaneous 3 times per day Jerrye Noble, MD   5,000 Units at 12/24/14 [phone]  . LORazepam (ATIVAN) injection 1 mg  1 mg Intravenous Q6H PRN Alexa Sherral Hammers, MD   1 mg at 12/22/14 2229  . methadone (DOLOPHINE) tablet 20 mg  20 mg Oral Q6H Alexa Sherral Hammers, MD   20 mg at 12/24/14 0854  . multivitamin with minerals tablet 1 tablet  1 tablet Oral Daily Otho Bellows, MD   1 tablet at 12/23/14 1122  . nicotine (NICODERM CQ - dosed in mg/24 hours) patch 21 mg  21 mg Transdermal Daily Anders Simmonds, MD   21 mg at 12/23/14 1617  . ondansetron (ZOFRAN) injection 4 mg  4 mg Intravenous Q6H PRN Jerrye Noble, MD      . phenol [facility]) mouth spray 1 spray  1 spray Mouth/Throat PRN Jerrye Noble, MD      . thiamine (VITAMIN B-1) tablet 100 mg  100 mg Oral Daily Otho Bellows, MD   100 mg at 12/23/14 1122   Or  . thiamine (B-1) injection 100 mg  100 mg Intravenous Daily Otho Bellows, MD   100 mg at 12/22/14 1000    Musculoskeletal: Strength & Muscle Tone: decreased Gait & Station: unable to stand Christina Cohen leans: N/A  Psychiatric Specialty  Exam: Physical Exam as per history and physical   ROS substance abuse, irritability, agitation   Blood pressure 101/66, pulse 90, temperature 98.1 F (36.7 C), temperature source Oral, resp. rate 18, height _0  (1.651 m), weight 48.5 kg (106 lb 14.8 oz), SpO2 100 %.Body mass index is 17.79 kg/(m^2).  General Appearance: Casual  Eye Contact::  Good  Speech:  Clear and Coherent and Normal Rate  Volume:  Normal  Mood:  Anxious  Affect:  Appropriate and Congruent  Thought Process:  Coherent and Goal Directed  Orientation:  Full (Time, Place, and Person)  Thought Content:  WDL  Suicidal Thoughts:  No  Homicidal Thoughts:  No  Memory:  Immediate;   Fair Recent;   Fair  Judgement:  Intact  Insight:  Fair  Psychomotor Activity:  Normal  Concentration:  Good  Recall:  Good  Fund of Knowledge:Good  Language: Fair  Akathisia:  Negative  Handed:  Right  AIMS (if indicated):     Assets:  Communication Skills Desire for Improvement Leisure Time Resilience Social Support  ADL's:  Intact  Cognition: WNL  Sleep:      Medical Decision Making: Review of Psycho-Social Stressors (1), Review or order clinical lab tests (1), Established Problem, Worsening (2), Review or order medicine tests (1), Review of Medication Regimen & Side Effects (2) and Review of New Medication or Change in Dosage (2)  Treatment Plan Summary: Daily contact with Christina Cohen to assess and evaluate symptoms and progress in treatment and Medication management  Plan:  Christina Cohen has no symptoms of opiate and benzodiazepine withdrawal today Discontinue Haldol 2 mg and Ativan 1 mg IM/IV every 6 hours - no reported agitation and aggression Continue methadone 20 mg to prevent opioid withdrawal symptoms Christina Cohen will be referred to outpatient methadone clinic Christina Cohen, Christina Cohen Christina Cohen does not meet involuntary commitment as she has no safety concerns Christina Cohen does not meet criteria for psychiatric inpatient admission. Supportive  therapy provided about ongoing stressors.  Appreciate psychiatric consultation and sign off today Please contact [phone] or [phone] if needs further assistance  Disposition: Christina Cohen  will be referred to the outpatient methadone clinic  and medically stable. Christina Cohen has no safety concerns and not meeting criteria for inpatient hospitalization.  Christina Cohen,Christina R. 12/24/2014 9:53 AM

## 2014-12-25 DIAGNOSIS — D696 Thrombocytopenia, unspecified: Secondary | ICD-10-CM

## 2014-12-25 DIAGNOSIS — R400 Somnolence: Secondary | ICD-10-CM | POA: Insufficient documentation

## 2014-12-25 LAB — CBC WITH DIFFERENTIAL/PLATELET
BASOS ABS: 0 10*3/uL (ref 0.0–0.1)
Basophils Relative: 1 % (ref 0–1)
Eosinophils Absolute: 0.2 10*3/uL (ref 0.0–0.7)
Eosinophils Relative: 6 % — ABNORMAL HIGH (ref 0–5)
HEMATOCRIT: 33.5 % — AB (ref 36.0–46.0)
HEMOGLOBIN: 11.2 g/dL — AB (ref 12.0–15.0)
LYMPHS PCT: 40 % (ref 12–46)
Lymphs Abs: 1.4 10*3/uL (ref 0.7–4.0)
MCH: 30.4 pg (ref 26.0–34.0)
MCHC: 33.4 g/dL (ref 30.0–36.0)
MCV: 91 fL (ref 78.0–100.0)
MONO ABS: 0.3 10*3/uL (ref 0.1–1.0)
MONOS PCT: 7 % (ref 3–12)
NEUTROS ABS: 1.7 10*3/uL (ref 1.7–7.7)
Neutrophils Relative %: 46 % (ref 43–77)
Platelets: 196 10*3/uL (ref 150–400)
RBC: 3.68 MIL/uL — AB (ref 3.87–5.11)
RDW: 14.1 % (ref 11.5–15.5)
WBC: 3.6 10*3/uL — AB (ref 4.0–10.5)

## 2014-12-25 LAB — COMPREHENSIVE METABOLIC PANEL
ALBUMIN: 3.3 g/dL — AB (ref 3.5–5.2)
ALK PHOS: 416 U/L — AB (ref 39–117)
ALT: 105 U/L — ABNORMAL HIGH (ref 0–35)
AST: 84 U/L — AB (ref 0–37)
Anion gap: 9 (ref 5–15)
BUN: 12 mg/dL (ref 6–23)
CO2: 25 mmol/L (ref 19–32)
Calcium: 9.3 mg/dL (ref 8.4–10.5)
Chloride: 103 mmol/L (ref 96–112)
Creatinine, Ser: 0.87 mg/dL (ref 0.50–1.10)
GFR calc Af Amer: >90 mL/min (ref >90)
GFR calc non Af Amer: 90 mL/min — ABNORMAL LOW (ref >90)
Glucose, Bld: 107 mg/dL — ABNORMAL HIGH (ref 70–99)
POTASSIUM: 4.2 mmol/L (ref 3.5–5.1)
SODIUM: 137 mmol/L (ref 135–145)
TOTAL PROTEIN: 6.9 g/dL (ref 6.0–8.3)
Total Bilirubin: 0.4 mg/dL (ref 0.3–1.2)

## 2014-12-25 LAB — GLUCOSE, CAPILLARY
Glucose-Capillary: 111 mg/dL — ABNORMAL HIGH (ref 70–99)
Glucose-Capillary: 98 mg/dL (ref 70–99)

## 2014-12-25 LAB — PHOSPHORUS: PHOSPHORUS: 4.5 mg/dL (ref 2.3–4.6)

## 2014-12-25 LAB — MAGNESIUM: Magnesium: 2 mg/dL (ref 1.5–2.5)

## 2014-12-25 MED ORDER — NICOTINE 21 MG/24HR TD PT24: 21.0000 mg | MEDICATED_PATCH | Freq: Every day | TRANSDERMAL | Status: DC

## 2014-12-25 NOTE — Progress Notes (Signed)
[employer] notified about pt's discharge and at bedside with hospital security.  Reviewed discharge instructions with pt and PIV removed.  Pt denied any needs at this time.

## 2014-12-25 NOTE — Clinical Social Work Psych Note (Addendum)
Psych CSW met with patient to review disposition.  Patient is calm, cooperative and states she is ready to go home. Patient is not currently suicidal or homicidal.  Patient endorses no safety concerns at this time.  Psych CSW was informed that patient was in need of information on [facility] in [place].  This information was given to the patient, though the patient states she has the resource information and has been in contact with them in the past.  Patient is also concerned about receiving her daily dosage of Methadone today since her Methadone Clinic closes at [place] will raise concern to medical staff.  Patient also encouraged to speak with RN regarding this request.  Patient instructed to keep her AVS and have handy for her Methadone Clinic for proof of hospitalization.  Patient is agreeable to follow up with both the [facility] in [place] and her Methadone Clinic.  Patient request no additional resources and states has everything already set up.  Psych CSW remains available for assistance if needs should arise.  Disposition: home with outpatient follow-up  Nonnie Done, LCSW [phone]  Psychiatric & Orthopedics (5N 1-8) Clinical Social Worker

## 2014-12-25 NOTE — Clinical Social Work Note (Signed)
IVC paperwork has been rescinded. Notice of Commitment Change has been completed, signed by MD, and faxed to the magistrate. Original Notice of Commitment Change form has been placed in the patient's chart. RN and MD notified. CSW signing off.  Roddie McBryant Qaadir Kent MSW, [place]CSWA, [place], [phone]

## 2014-12-25 NOTE — Care Management Note (Signed)
    Page 1 of 1   12/25/2014     11:55:45 AM CARE MANAGEMENT NOTE 12/25/2014  Patient:  Hazle CocaBEACH,Keishla N   Account Number:  [id number]  Date Initiated:  12/22/2014  Documentation initiated by:  [facility]  Subjective/Objective Assessment:   Admitted with unresponsiveness - OD - intubated.     Action/Plan:   dc to law enforcement   Anticipated DC Date:  12/25/2014   Anticipated DC Plan:  [facility]  In-house referral  Clinical Social Worker      DC Planning Services  CM consult  [facility]      Choice offered to / List presented to:             Status of service:  In process, will continue to follow Medicare Important Message given?   (If response is "NO", the following Medicare IM given date fields will be blank) Date Medicare IM given:   Medicare IM given by:   Date Additional Medicare IM given:   Additional Medicare IM given by:    Discharge Disposition:  [facility]  Per UR Regulation:  Reviewed for med. necessity/level of care/duration of stay  If discussed at Long Length of Stay Meetings, dates discussed:   12/25/2014    Comments:  4-28-16dc'd to corrections, [facility] info given for later use. Lawerance Sabalebbie Swist RN BSN CM  Contact:  Husband Dorinda Hill- Donald [phone] - or [phone]                 friend Morrie Sheldon- Abigale - [phone]

## 2014-12-25 NOTE — Consult Note (Signed)
Psychiatry Consult follow-up  Reason for Consult:  AMS, S/P polysubstance overdose and intubation and agitation Referring Physician:  Dr. Lamonte Sakai Patient Identification: Christina Cohen MRN:  [id number] Principal Diagnosis: Polysubstance dependence including opioid type drug, episodic abuse, with perceptual disturbance Diagnosis:   Patient Active Problem List   Diagnosis Date Noted  . Thrombocytopenia [D69.6] 12/24/2014  . Polysubstance dependence including opioid type drug, episodic abuse, with perceptual disturbance [F11.222] 12/22/2014  . Endotracheally intubated [Z78.9]   . Altered mental status [R41.82] 12/21/2014    Total Time spent with patient: 20 minutes  Subjective:   Christina Cohen is a 29 y.o. female patient admitted with AMS, S/P polysubstance overdose and intubation and agitation.  HPI:  Christina Cohen is a 29 year old female admitted to [facility] intensive care unit for altered mental status secondary to polysubstance overdose especially benzodiazepines and opioids. Patient reportedly known as a heroin dependent and reportedly overdosed on Xanax and found unresponsive at before admission to the hospital. Patient urine drug screen is positive for opiates and benzodiazepines. Patient reported she has been taking methadone 90 mg [facility] Patient was found severely irritable, agitated, hallucination and delusional thinking and required medication this morning. Reportedly patient is also having trouble with the relationship. Patient is a poor historian during this evaluation secondary to being sedated with medications . Case discussed with Dr. Marvel Plan and Titus Mould and agree with the medication to control her agitation and aggressive behavior. Patient denies history of bipolar disorder and acute psychiatric hospitalization. Patient denied current symptoms of suicidal, homicidal ideation.  12/24/2014  Interval history: Patient appeared talking on  cell phone with her husband. Patient reported she has been feeling much better and has a clear sensorium. Patient appeared calm and cooperative during this evaluation. Patient stated she was arrested for DWI because she was taken methadone 90 mg and also 2 pieces of her mom's Xanax before going to the store to buy cigarettes and found intoxicated. Reportedly patient stopped breathing which required to bring her to the [facility] emergency department and then placed in ICU. Patient was noncomprehensive irritable and aggressive at that time and required sedation with medication. Patient has been cleared clinically without symptoms of withdrawal from benzodiazepines and opiates. Patient  medication methadone  was increased to 20 mg every 6 hours which  she tolerated well. Patient reported she has a 69 years old son with his father. She was remarried 3 years ago and her current husband also involved with a drug of abuse especially opiates and also on methadone treatment. Patient minimizes her illicit drug use for the last 6 months and no incarceration for last one year. Patient has no regrets about her drug of abuse.  Patient has no auditory or visual hallucinations, delusions or paranoia. Patient adamantly denied having suicidal/homicidal ideation, intention or plans. Patient denies history of bipolar illness and  inpatient or outpatient treatment but endorses being in rehabilitation treatment for substance abuse at [place] several years ago. Case discussed with Dr. Chase Caller. Patient stated she may need to go to jail overnight for DWI and has plans to go outpatient methadone clinic.  Medical history: Patient with history of bipolar disorder who presented to the ED in police custody for altered mental status initially attributed to alcohol intoxication. At the police department, she was verbalizing initially but then became unresponsive after 2 blood draws were performed. She was then referred to the emergency  department and found to have UDS notable for positive  benzodiazepine and methadone along with a stool covered bag with 19 ovoid blue pills in her gluteal cleft. She then received ceftriaxone 1 for UA notable for nitrites and leukocyte esterase. She was intubated for inability to protect her airway and transferred here for further management.  Past Medical History: History reviewed. No pertinent past medical history. History reviewed. No pertinent past surgical history. Family History:  Family History  Problem Relation Age of Onset  . Diabetes Maternal Grandmother   . Thyroid disease Maternal Grandmother    Social History:  History  Alcohol Use: Not on file     History  Drug Use Not on file    History   Social History  . Marital Status: Single    Spouse Name: N/A  . Number of Children: N/A  . Years of Education: N/A   Social History Main Topics  . Smoking status: Current Every Day Smoker -- 2.00 packs/day for 10 years    Types: Cigarettes  . Smokeless tobacco: Not on file  . Alcohol Use: Not on file  . Drug Use: Not on file  . Sexual Activity: Not on file   Other Topics Concern  . None   Social History Narrative  . None   Additional Social History:                          Allergies:  No Known Allergies  Labs:  Results for orders placed or performed during the hospital encounter of 12/21/14 (from the past 48 hour(s))  HCV RNA quant     Status: Abnormal   Collection Time: 12/23/14 11:10 AM  Result Value Ref Range   HCV Quantitative 15063 (H) <15 IU/mL   HCV Quantitative Log 4.18 (H) <1.18 log 10    Comment: (NOTE) This test utilizes the [place] FDA approved Roche HCV Test Kit by RT-PCR. Performed at [facility]   Glucose, capillary     Status: Abnormal   Collection Time: 12/23/14 12:08 PM  Result Value Ref Range   Glucose-Capillary 113 (H) 70 - 99 mg/dL  Glucose, capillary     Status: Abnormal   Collection Time: 12/23/14  5:49 PM  Result Value  Ref Range   Glucose-Capillary 114 (H) 70 - 99 mg/dL  Basic metabolic panel     Status: Abnormal   Collection Time: 12/24/14  7:20 AM  Result Value Ref Range   Sodium 135 135 - 145 mmol/L   Potassium 3.1 (L) 3.5 - 5.1 mmol/L   Chloride 101 96 - 112 mmol/L   CO2 24 19 - 32 mmol/L   Glucose, Bld 90 70 - 99 mg/dL   BUN 6 6 - 23 mg/dL   Creatinine, Ser 0.97 0.50 - 1.10 mg/dL   Calcium 8.2 (L) 8.4 - 10.5 mg/dL   GFR calc non Af Amer 79 (L) >90 mL/min   GFR calc Af Amer >90 >90 mL/min    Comment: (NOTE) The eGFR has been calculated using the CKD EPI equation. This calculation has not been validated in all clinical situations. eGFR's persistently <90 mL/min signify possible Chronic Kidney Disease.    Anion gap 10 5 - 15  CBC     Status: Abnormal   Collection Time: 12/24/14  7:20 AM  Result Value Ref Range   WBC 4.1 4.0 - 10.5 K/uL   RBC 4.24 3.87 - 5.11 MIL/uL   Hemoglobin 12.0 12.0 - 15.0 g/dL   HCT 36.1 36.0 - 46.0 %  MCV 85.1 78.0 - 100.0 fL   MCH 28.3 26.0 - 34.0 pg   MCHC 33.2 30.0 - 36.0 g/dL   RDW 18.6 (H) 11.5 - 15.5 %   Platelets 52 (L) 150 - 400 K/uL    Comment: SPECIMEN CHECKED FOR CLOTS REPEATED TO VERIFY PLATELET COUNT CONFIRMED BY SMEAR   Glucose, capillary     Status: Abnormal   Collection Time: 12/24/14 11:59 AM  Result Value Ref Range   Glucose-Capillary 132 (H) 70 - 99 mg/dL  Glucose, capillary     Status: Abnormal   Collection Time: 12/24/14  5:25 PM  Result Value Ref Range   Glucose-Capillary 119 (H) 70 - 99 mg/dL  Glucose, capillary     Status: Abnormal   Collection Time: 12/25/14 12:24 AM  Result Value Ref Range   Glucose-Capillary 111 (H) 70 - 99 mg/dL   Comment 1 Notify RN    Comment 2 Document in Chart   Comprehensive metabolic panel     Status: Abnormal   Collection Time: 12/25/14  5:42 AM  Result Value Ref Range   Sodium 137 135 - 145 mmol/L   Potassium 4.2 3.5 - 5.1 mmol/L    Comment: DELTA CHECK NOTED   Chloride 103 96 - 112 mmol/L    CO2 25 19 - 32 mmol/L   Glucose, Bld 107 (H) 70 - 99 mg/dL   BUN 12 6 - 23 mg/dL   Creatinine, Ser 0.87 0.50 - 1.10 mg/dL   Calcium 9.3 8.4 - 10.5 mg/dL   Total Protein 6.9 6.0 - 8.3 g/dL   Albumin 3.3 (L) 3.5 - 5.2 g/dL   AST 84 (H) 0 - 37 U/L   ALT 105 (H) 0 - 35 U/L   Alkaline Phosphatase 416 (H) 39 - 117 U/L   Total Bilirubin 0.4 0.3 - 1.2 mg/dL   GFR calc non Af Amer 90 (L) >90 mL/min   GFR calc Af Amer >90 >90 mL/min    Comment: (NOTE) The eGFR has been calculated using the CKD EPI equation. This calculation has not been validated in all clinical situations. eGFR's persistently <90 mL/min signify possible Chronic Kidney Disease.    Anion gap 9 5 - 15  CBC with Differential/Platelet     Status: Abnormal   Collection Time: 12/25/14  5:42 AM  Result Value Ref Range   WBC 3.6 (L) 4.0 - 10.5 K/uL   RBC 3.68 (L) 3.87 - 5.11 MIL/uL   Hemoglobin 11.2 (L) 12.0 - 15.0 g/dL   HCT 33.5 (L) 36.0 - 46.0 %   MCV 91.0 78.0 - 100.0 fL   MCH 30.4 26.0 - 34.0 pg   MCHC 33.4 30.0 - 36.0 g/dL   RDW 14.1 11.5 - 15.5 %   Platelets 196 150 - 400 K/uL    Comment: DELTA CHECK NOTED   Neutrophils Relative % 46 43 - 77 %   Neutro Abs 1.7 1.7 - 7.7 K/uL   Lymphocytes Relative 40 12 - 46 %   Lymphs Abs 1.4 0.7 - 4.0 K/uL   Monocytes Relative 7 3 - 12 %   Monocytes Absolute 0.3 0.1 - 1.0 K/uL   Eosinophils Relative 6 (H) 0 - 5 %   Eosinophils Absolute 0.2 0.0 - 0.7 K/uL   Basophils Relative 1 0 - 1 %   Basophils Absolute 0.0 0.0 - 0.1 K/uL  Magnesium     Status: None   Collection Time: 12/25/14  5:42 AM  Result Value Ref Range  Magnesium 2.0 1.5 - 2.5 mg/dL  Phosphorus     Status: None   Collection Time: 12/25/14  5:42 AM  Result Value Ref Range   Phosphorus 4.5 2.3 - 4.6 mg/dL    Vitals: Blood pressure 96/53, pulse 87, temperature 98.7 F (37.1 C), temperature source Oral, resp. rate 18, height $RemoveBe'5\' 5"'kaDiyRHRZ$  (1.651 m), weight 47.7 kg (105 lb 2.6 oz), SpO2 100 %.  Risk to Self: Is patient at  risk for suicide?: Yes Risk to Others:   Prior Inpatient Therapy:   Prior Outpatient Therapy:    Current Facility-Administered Medications  Medication Dose Route Frequency Provider Last Rate Last Dose  . docusate (COLACE) 50 MG/5ML liquid 100 mg  100 mg Per Tube BID PRN Jerrye Noble, MD      . folic acid (FOLVITE) tablet 1 mg  1 mg Oral Daily Otho Bellows, MD   1 mg at 12/24/14 1025  . LORazepam (ATIVAN) tablet 1 mg  1 mg Oral Q6H PRN Brand Males, MD       Or  . LORazepam (ATIVAN) injection 1 mg  1 mg Intravenous Q6H PRN Brand Males, MD      . LORazepam (ATIVAN) tablet 0-4 mg  0-4 mg Oral Q6H Brand Males, MD   0 mg at 12/24/14 1451   Followed by  . [START ON 12/26/2014] LORazepam (ATIVAN) tablet 0-4 mg  0-4 mg Oral Q12H Brand Males, MD      . methadone (DOLOPHINE) tablet 20 mg  20 mg Oral Q6H Alexa Sherral Hammers, MD   20 mg at 12/25/14 0902  . multivitamin with minerals tablet 1 tablet  1 tablet Oral Daily Otho Bellows, MD   1 tablet at 12/24/14 1025  . nicotine (NICODERM CQ - dosed in mg/24 hours) patch 21 mg  21 mg Transdermal Daily Anders Simmonds, MD   21 mg at 12/24/14 1030  . ondansetron (ZOFRAN) injection 4 mg  4 mg Intravenous Q6H PRN Jerrye Noble, MD      . phenol [facility]) mouth spray 1 spray  1 spray Mouth/Throat PRN Jerrye Noble, MD   1 spray at 12/24/14 1025  . thiamine (VITAMIN B-1) tablet 100 mg  100 mg Oral Daily Otho Bellows, MD   100 mg at 12/24/14 1025    Musculoskeletal: Strength & Muscle Tone: decreased Gait & Station: unable to stand Patient leans: N/A  Psychiatric Specialty Exam: Physical Exam as per history and physical   ROS substance abuse, irritability, agitation   Blood pressure 96/53, pulse 87, temperature 98.7 F (37.1 C), temperature source Oral, resp. rate 18, height $RemoveBe'5\' 5"'cOFtFPVeI$  (1.651 m), weight 47.7 kg (105 lb 2.6 oz), SpO2 100 %.Body mass index is 17.5 kg/(m^2).  General Appearance: Casual  Eye Contact::  Good  Speech:   Clear and Coherent and Normal Rate  Volume:  Normal  Mood:  Anxious  Affect:  Appropriate and Congruent  Thought Process:  Coherent and Goal Directed  Orientation:  Full (Time, Place, and Person)  Thought Content:  WDL  Suicidal Thoughts:  No  Homicidal Thoughts:  No  Memory:  Immediate;   Fair Recent;   Fair  Judgement:  Intact  Insight:  Fair  Psychomotor Activity:  Normal  Concentration:  Good  Recall:  Good  Fund of Knowledge:Good  Language: Fair  Akathisia:  Negative  Handed:  Right  AIMS (if indicated):     Assets:  Communication Skills Desire for Improvement Leisure Time Resilience Social Support  ADL's:  Intact  Cognition: WNL  Sleep:      Medical Decision Making: Review of Psycho-Social Stressors (1), Review or order clinical lab tests (1), Established Problem, Worsening (2), Review or order medicine tests (1), Review of Medication Regimen & Side Effects (2) and Review of New Medication or Change in Dosage (2)  Treatment Plan Summary: Daily contact with patient to assess and evaluate symptoms and progress in treatment and Medication management  Plan:  Patient appeared to be stable on her benzodiazepines/opioid withdrawals, agitation and aggression and has no current suicidal and homicidal ideation and willing to participate [facility] or [facility] in [place]. Case discussed with the psychiatric social service regarding possibility of referring her to [facility].  Substance abuse/withdrawal: Patient has no symptoms of opiate and benzodiazepine withdrawal today  Agitation and aggression: Discontinue Haldol 2 mg and Ativan 1 mg IM/IV every 6 hours - no reported agitation and aggression today  Methadone maintenance for opiate dependence: Continue methadone 20 mg to prevent opioid withdrawal symptoms  Patient will be referred to outpatient methadone clinic [facility], [place]  Patient does not meet involuntary commitment as she has no safety  concerns  Patient does not meet criteria for psychiatric inpatient admission. Supportive therapy provided about ongoing stressors.   Appreciate psychiatric consultation and sign off today Please contact [phone] or [phone] if needs further assistance  Disposition: Patient  will be referred to the outpatient methadone clinic and medically stable. Patient has no safety concerns and not meeting criteria for inpatient hospitalization.  Mycal Conde,JANARDHAHA R. 12/25/2014 10:56 AM

## 2014-12-25 NOTE — Discharge Summary (Signed)
Physician Discharge Summary  [facility] ZOX:[id number] DOB: [birth year] DOA: 12/21/2014  PCP: No primary care provider on file.  Admit date: 12/21/2014 Discharge date: 12/25/2014  Time spent: 35 minutes  Recommendations for Outpatient Follow-up:  1. Patient to go to rehab- information given by Dr. Shela CommonsJ 2. Outpatient Hep C treatment 3. Avoid hepatotoxic medications 4. CMP 2 week re AST/ALT  Discharge Diagnoses:  Principal Problem:   Polysubstance dependence including opioid type drug, episodic abuse, with perceptual disturbance Active Problems:   Altered mental status   Endotracheally intubated   Thrombocytopenia   Discharge Condition: improved  Diet recommendation: regular  Filed Weights   12/23/14 0500 12/24/14 0529 12/25/14 0649  Weight: 50.3 kg (110 lb 14.3 oz) 48.5 kg (106 lb 14.8 oz) 47.7 kg (105 lb 2.6 oz)    History of present illness:  Christina Cohen is a 29 year old female with history of bipolar disorder who presented to the ED in police custody for altered mental status initially attributed to alcohol intoxication. At the police department, she was verbalizing initially but then became unresponsive after 2 blood draws were performed. She was then referred to the emergency department and found to have UDS notable for positive benzodiazepine and methadone along with a stool covered bag with 19 ovoid blue pills in her gluteal cleft. She then received ceftriaxone 1 for UA notable for nitrites and leukocyte esterase. She was intubated for inability to protect her airway and transferred here for further management.   Hospital Course:  Christina Cohen is a 29 year old female with bipolar disorder, polysubstance abuse who presented to OSH with acute toxic encephalopathy complicated by acute respiratory failure secondary to alcohol intoxication and drug overdose. Her respiratory failure is resolved. Seen by psych- no signs of withdrawal- ok to d/c Hep C-outpatient treatment plts  stable  Procedures:  intubation  Consultations:  PCCM  Discharge Exam: Filed Vitals:   12/25/14 0857  BP: 96/53  Pulse: 87  Temp: 98.7 F (37.1 C)  Resp:     General: A+Ox3, NAD Cardiovascular: rrr Respiratory: clear  Discharge Instructions   Discharge Instructions    Diet general    Complete by:  As directed      Discharge instructions    Complete by:  As directed   Rehab information given     Increase activity slowly    Complete by:  As directed           Current Discharge Medication List    START taking these medications   Details  nicotine (NICODERM CQ - DOSED IN MG/24 HOURS) 21 mg/24hr patch Place 1 patch (21 mg total) onto the skin daily. Qty: 28 patch, Refills: 0      CONTINUE these medications which have NOT CHANGED   Details  methadone (DOLOPHINE) 10 MG/ML solution Take 90 mg by mouth daily.       No Known Allergies Follow-up Information    Please follow up.   Why:  PCP 1-2 weeks       The results of significant diagnostics from this hospitalization (including imaging, microbiology, ancillary and laboratory) are listed below for reference.    Significant Diagnostic Studies: Dg Abd 1 View  12/21/2014   CLINICAL DATA:  Overdose  EXAM: ABDOMEN - 1 VIEW  COMPARISON:  06/04/2009  FINDINGS: Image quality degraded by motion  Normal bowel gas pattern. No bowel obstruction or ileus. No abnormal calcifications. No significant skeletal abnormality.  IMPRESSION: Negative.   Electronically Signed   By: Marlan Palauharles  Clark  M.D.   On: 12/21/2014 21:05   Dg Chest Port 1 View  12/22/2014   CLINICAL DATA:  29 year old female, intubated  EXAM: PORTABLE CHEST - 1 VIEW  COMPARISON:  None  FINDINGS: Endotracheal tube tip is 3.4 cm above the carina. A nasogastric tube is present. The tip projects over the gastric body. Cardiac and mediastinal contours are within normal limits. Low inspiratory volumes with perhaps mild bibasilar atelectasis. No focal airspace  consolidation, pleural effusion or pneumothorax. No evidence of acute osseous abnormality.  IMPRESSION: 1. The tip of the endotracheal tube is 3.4 cm above the carina. 2. The tip of the nasogastric tube projects over the gastric body. 3. Low inspiratory volumes with mild bibasilar subsegmental atelectasis.   Electronically Signed   By: Malachy Moan M.D.   On: 12/22/2014 00:50   Dg Chest Port 1 View  12/21/2014   CLINICAL DATA:  Endotracheal tube and orogastric tube placement. Initial encounter.  EXAM: PORTABLE CHEST - 1 VIEW  COMPARISON:  Chest radiograph performed earlier today at 8:06 p.m.  FINDINGS: The patient's endotracheal tube is seen ending 1.7 cm above the carina. This could be retracted approximately 1 cm, as deemed clinically appropriate.  The enteric tube is noted extending below the diaphragm.  Pulmonary vascularity is at the upper limits of normal. No focal consolidation, pleural effusion or pneumothorax is seen.  The cardiomediastinal silhouette is borderline normal in size. No acute osseous abnormalities are identified.  IMPRESSION: 1. Endotracheal tube seen ending 1.7 cm above the carina. This could be retracted approximately 1 cm, as deemed clinically appropriate. 2. Enteric tube noted extending below the diaphragm. 3. No acute cardiopulmonary process seen.   Electronically Signed   By: Roanna Raider M.D.   On: 12/21/2014 21:35   Dg Chest Port 1 View  12/21/2014   CLINICAL DATA:  Overdose  EXAM: PORTABLE CHEST - 1 VIEW  COMPARISON:  None.  FINDINGS: Lung volume is normal. Lungs are clear without infiltrate or effusion. Negative for pulmonary edema. No mass lesion.  IMPRESSION: No active disease.   Electronically Signed   By: Marlan Palau M.D.   On: 12/21/2014 21:04    Microbiology: Recent Results (from the past 240 hour(s))  MRSA PCR Screening     Status: None   Collection Time: 12/22/14 12:00 AM  Result Value Ref Range Status   MRSA by PCR NEGATIVE NEGATIVE Final    Comment:         The GeneXpert MRSA Assay (FDA approved for NASAL specimens only), is one component of a comprehensive MRSA colonization surveillance program. It is not intended to diagnose MRSA infection nor to guide or monitor treatment for MRSA infections.      Labs: Basic Metabolic Panel:  Recent Labs Lab 12/22/14 0054 12/23/14 0216 12/24/14 0720 12/25/14 0542  NA 136 137 135 137  K 4.4 3.8 3.1* 4.2  CL 102 107 101 103  CO2 GLUCOSE 80 106* 90 107*  BUN CREATININE 0.90 0.73 0.97 0.87  CALCIUM 8.3* 8.7 8.2* 9.3  MG  --   --   --  2.0  PHOS  --   --   --  4.5   Liver Function Tests:  Recent Labs Lab 12/22/14 0054 12/23/14 0216 12/25/14 0542  AST 89* 52* 84*  ALT 83* 67* 105*  ALKPHOS 396* 366* 416*  BILITOT 0.8 0.6 0.4  PROT 6.5 5.6* 6.9  ALBUMIN 3.4* 2.9* 3.3*  No results for input(s): LIPASE, AMYLASE in the last 168 hours. No results for input(s): AMMONIA in the last 168 hours. CBC:  Recent Labs Lab 12/22/14 0054 12/24/14 0720 12/25/14 0542  WBC 4.8 4.1 3.6*  NEUTROABS  --   --  1.7  HGB 12.2 12.0 11.2*  HCT 36.4 36.1 33.5*  MCV 91.7 85.1 91.0  PLT 164 52* 196   Cardiac Enzymes: No results for input(s): CKTOTAL, CKMB, CKMBINDEX, TROPONINI in the last 168 hours. BNP: BNP (last 3 results) No results for input(s): BNP in the last 8760 hours.  ProBNP (last 3 results) No results for input(s): PROBNP in the last 8760 hours.  CBG:  Recent Labs Lab 12/23/14 1208 12/23/14 1749 12/24/14 1159 12/24/14 1725 12/25/14 0024  GLUCAP 113* 114* 132* 119* 111*       Signed:  Julicia Krieger  [facility] 12/25/2014, 11:00 AM

## 2014-12-29 LAB — HEPATITIS C GENOTYPE

## 2015-07-11 ENCOUNTER — Encounter: Payer: Self-pay | Admitting: *Deleted

## 2015-07-11 ENCOUNTER — Emergency Department
Admission: EM | Admit: 2015-07-11 | Discharge: 2015-07-11 | Disposition: A | Discharge status: Home / Self Care | Payer: Self-pay | Attending: Emergency Medicine | Admitting: Emergency Medicine

## 2015-07-11 DIAGNOSIS — M79601 Pain in right arm: Secondary | ICD-10-CM

## 2015-07-11 DIAGNOSIS — M7981 Nontraumatic hematoma of soft tissue: Secondary | ICD-10-CM | POA: Insufficient documentation

## 2015-07-11 DIAGNOSIS — Z720 Tobacco use: Secondary | ICD-10-CM | POA: Insufficient documentation

## 2015-07-11 DIAGNOSIS — M79621 Pain in right upper arm: Secondary | ICD-10-CM | POA: Insufficient documentation

## 2015-07-11 NOTE — ED Provider Notes (Signed)
[facility] Emergency Department Provider Note  ____________________________________________  Time seen: 3:40 AM  I have reviewed the triage vital signs and the nursing notes.   HISTORY  Chief Complaint Arm Injury    HPI Christina Cohen is a 29 y.o. female who complains of right arm pain and left wrist pain. She describes it as paralysis but is able to demonstrate full range of motion and intact strength of both areas. She is concerned that because she injects drugs and then passes out, she could've had a stroke as she had the symptoms when she woke up 5 weeks ago after drug use. Denies fevers or chills, no chest pain or shortness of breath.     History reviewed. No pertinent past medical history.   Patient Active Problem List   Diagnosis Date Noted  . Somnolence   . Thrombocytopenia (HCC) 12/24/2014  . Polysubstance dependence including opioid type drug, episodic abuse, with perceptual disturbance (HCC) 12/22/2014  . Endotracheally intubated   . Altered mental status 12/21/2014     Past Surgical History  Procedure Laterality Date  . Cesarean section       No current outpatient prescriptions on file.   Allergies Review of patient's allergies indicates no known allergies.   Family History  Problem Relation Age of Onset  . Diabetes Maternal Grandmother   . Thyroid disease Maternal Grandmother     Social History Social History  Substance Use Topics  . Smoking status: Current Every Day Smoker -- 1.00 packs/day for 10 years    Types: Cigarettes  . Smokeless tobacco: None  . Alcohol Use: No    Review of Systems  Constitutional:   No fever or chills. No weight changes Eyes:   No blurry vision or double vision.  ENT:   No sore throat. Cardiovascular:   No chest pain. Respiratory:   No dyspnea or cough. Gastrointestinal:   Negative for abdominal pain, vomiting and diarrhea.  No BRBPR or melena. Genitourinary:   Negative for dysuria,  urinary retention, bloody urine, or difficulty urinating. Musculoskeletal:   Right upper arm pain, left wrist pain. Skin:   Negative for rash. Neurological:   Negative for headaches, focal weakness or numbness. Psychiatric:  No anxiety or depression.   Endocrine:  No hot/cold intolerance, changes in energy, or sleep difficulty.  10-point ROS otherwise negative.  ____________________________________________   PHYSICAL EXAM:  VITAL SIGNS: ED Triage Vitals  Enc Vitals Group     BP 07/11/15 0300 115/85 mmHg     Pulse Rate 07/11/15 0300 103     Resp 07/11/15 0300 18     Temp 07/11/15 0300 98.5 F (36.9 C)     Temp Source 07/11/15 0300 Oral     SpO2 07/11/15 0300 99 %     Weight 07/11/15 0300 105 lb (47.628 kg)     Height 07/11/15 0300  (1.575 m)     Head Cir --      Peak Flow --      Pain Score 07/11/15 0301 8     Pain Loc --      Pain Edu? --      Excl. in GC? --      Constitutional:   Alert and oriented. Well appearing and in no distress. Eyes:   No scleral icterus. No conjunctival pallor. PERRL. EOMI ENT   Head:   Normocephalic and atraumatic.   Nose:   No congestion/rhinnorhea. No septal hematoma   Mouth/Throat:   MMM, no pharyngeal  erythema. No peritonsillar mass. No uvula shift.   Neck:   No stridor. No SubQ emphysema. No meningismus. Hematological/Lymphatic/Immunilogical:   No cervical lymphadenopathy. Cardiovascular:   RRR. Normal and symmetric distal pulses are present in all extremities. No murmurs, rubs, or gallops. Respiratory:   Normal respiratory effort without tachypnea nor retractions. Breath sounds are clear and equal bilaterally. No wheezes/rales/rhonchi. Gastrointestinal:   Soft and nontender. No distention. There is no CVA tenderness.  No rebound, rigidity, or guarding. Genitourinary:   deferred Musculoskeletal:   Nontender with normal range of motion in all extremities. No joint effusions.  No lower extremity tenderness.  No  edema. Neurologic:   Normal speech and language.  CN 2-10 normal. Motor grossly intact. No pronator drift.  Normal gait. Intact muscle strength in the affected areas. No gross focal neurologic deficits are appreciated.  Skin:    Skin is warm, dry and intact. Scattered areas of ecchymosis and track mark on the left forearm and wrist area.Marland Kitchen. Psychiatric:   Mood and affect are normal. Speech and behavior are normal. Patient exhibits appropriate insight and judgment.  ____________________________________________    LABS (pertinent positives/negatives) (all labs ordered are listed, but only abnormal results are displayed) Labs Reviewed - No data to display ____________________________________________   EKG    ____________________________________________    RADIOLOGY    ____________________________________________   PROCEDURES   ____________________________________________   INITIAL IMPRESSION / ASSESSMENT AND PLAN / ED COURSE  Pertinent labs & imaging results that were available during my care of the patient were reviewed by me and considered in my medical decision making (see chart for details).  Neuro intact. The patient's symptoms are likely related to neuropraxia or musculoskeletal pain from the likelihood that she falls asleep in awkward positions after using drugs, likely lying on an arm or a curled up wrist for hours until she wakes up again. As this is been going on for 5 weeks constantly according to the patient's report, there is not appear to be anything to do for it now. If there is any underlying nerve injury, this will either slowly recover on its own or proved to be permanent. However, on physical exam by myself and the nurse, there are inconsistencies that suggest that she does not have any actual motor weakness or deficits. After being examined by the nurse, the patient put her jacket back on, and then took her jacket backoff for my exam, all with fluid motion  and without any difficulty or apparent lack of function in either upper extremity. Patient is stable for discharge. No evidence of stroke, low suspicion for any neurologic pathology, no evidence of soft tissue infection or necrotizing fasciitis or abscess. No apparent traumatic injury. No other apparent complication of IV drug use such as endocarditis.     ____________________________________________   FINAL CLINICAL IMPRESSION(S) / ED DIAGNOSES  Final diagnoses:  Right arm pain      Sharman CheekPhillip Samar Venneman, MD 07/11/15 [phone]

## 2015-07-11 NOTE — ED Notes (Addendum)
Pt states left hand has been paralyzed for approx 5 weeks. Pt states awoke that way 5 weeks pta. Pt. Pt states "i can manipulate it, but i can't move my fingers." pt is currently texting on phone with both hands. Multiple injection sites noted to bilateral arms and hands. Pt states last injected in in left hand 2 weeks pta. Pt denies fever, no red streaks noted or swelling noted to hands or arms. Cms intact in all fingers(see later note). Pt states left hand and wrist are painful. Pt states is unable to lift right arm from shoulder. Pt then removes jacket lifting both arms without difficulty. 3+ bilateral radial pulses noted. Pt has left hand bent at wrist while speaking with RN. Pt then is rubbing both hands together in lap moving all fingers after RN assessment.

## 2015-07-11 NOTE — Discharge Instructions (Signed)

## 2015-07-11 NOTE — ED Notes (Signed)
Pt states "i'm stepping out to see my husband real quick and i'll be back." pt encouraged to wait for md discharge instructions. Pt states "i'll be back".

## 2015-07-11 NOTE — ED Notes (Signed)
Pt reports that she has had a left hand and her right upper arm 'have been paralyzed for 5 weeks'. Reports that she woke up like that, denies injury.

## 2016-06-03 ENCOUNTER — Encounter: Payer: Self-pay | Admitting: Emergency Medicine

## 2016-06-03 ENCOUNTER — Emergency Department
Admission: EM | Admit: 2016-06-03 | Discharge: 2016-06-03 | Disposition: A | Discharge status: Home / Self Care | Payer: Self-pay | Attending: Emergency Medicine | Admitting: Emergency Medicine

## 2016-06-03 DIAGNOSIS — K0889 Other specified disorders of teeth and supporting structures: Secondary | ICD-10-CM | POA: Insufficient documentation

## 2016-06-03 DIAGNOSIS — F17210 Nicotine dependence, cigarettes, uncomplicated: Secondary | ICD-10-CM | POA: Insufficient documentation

## 2016-06-03 MED ORDER — NAPROXEN 500 MG PO TABS: 500.0000 mg | ORAL_TABLET | Freq: Two times a day (BID) | ORAL | 0 refills | Status: DC

## 2016-06-03 MED ORDER — AMOXICILLIN 500 MG PO CAPS: 500.0000 mg | ORAL_CAPSULE | Freq: Three times a day (TID) | ORAL | 0 refills | Status: DC

## 2016-06-03 MED ORDER — LIDOCAINE VISCOUS 2 % MT SOLN
15.0000 mL | Freq: Once | OROMUCOSAL | Status: AC
  Administered 2016-06-03: 15 mL via OROMUCOSAL
  Filled 2016-06-03: qty 15

## 2016-06-03 MED ORDER — NAPROXEN 500 MG PO TABS
500.0000 mg | ORAL_TABLET | Freq: Once | ORAL | Status: AC
  Administered 2016-06-03: 500 mg via ORAL
  Filled 2016-06-03: qty 1

## 2016-06-03 MED ORDER — LIDOCAINE VISCOUS 2 % MT SOLN: 5.0000 mL | Freq: Four times a day (QID) | OROMUCOSAL | 0 refills | Status: DC | PRN

## 2016-06-03 NOTE — Discharge Instructions (Signed)
OPTIONS FOR DENTAL FOLLOW UP CARE ° °[place] [facility] and [facility] - Local Safety Net Dental Clinics °http://www.ncdhhs.gov/dph/oralhealth/services/safetynetclinics.htm °  °[facility] (336-562-3123) ° °[facility] [place] ([phone]) ° °[facility] ([phone] ext [phone]) ° °[facility] ([phone]) ° °[facility] ([phone]) °This clinic caters to the indigent population and is on a lottery system. °Location: °[facility], [address], [place] °Clinic Hours: °Wednesdays from 6pm - 9pm, patients seen by a lottery system. °For dates, call or go to www.med.[facility].edu/[facility]/patients/Dental-[facility] °Services: °Cleanings, fillings and simple extractions. °Payment Options: °DENTAL WORK IS FREE OF CHARGE. Bring proof of income or support. °Best way to get seen: °Arrive at 5:15 pm - this is a lottery, NOT first come/first serve, so arriving earlier will not increase your chances of being seen. °  °  °[facility] °[phone] °Select option 1 for emergencies °  °Location: °[facility], [address], [place] °Clinic Hours: °No walk-ins accepted - call the day before to schedule an appointment. °Check in times are 9:30 am and 1:30 pm. °Services: °Simple extractions, temporary fillings, pulpectomy/pulp debridement, uncomplicated abscess drainage. °Payment Options: °PAYMENT IS DUE AT THE TIME OF SERVICE.  Fee is usually $100-200, additional surgical procedures (e.g. abscess drainage) may be extra. °Cash, checks, Visa/MasterCard accepted.  Can file Medicaid if patient is covered for dental - patient should call case worker to check. °No discount for [facility] patients. °Best way to get seen: °MUST call the day before and get onto the schedule. Can usually be seen the next 1-2 days. No walk-ins accepted. °  °  °[facility] °[phone] °   °Location: °[facility], [address], [place] °Clinic Hours: °M, W, Th, F 8am or 1:30pm, Tues 9a or 1:30 - first come/first served. °Services: °Simple extractions, temporary fillings, uncomplicated abscess drainage.  You do not need to be an [place] resident. °Payment Options: °PAYMENT IS DUE AT THE TIME OF SERVICE. °Dental insurance, otherwise sliding scale - bring proof of income or support. °Depending on income and treatment needed, cost is usually $50-200. °Best way to get seen: °Arrive early as it is first come/first served. °  °  °[facility] °[phone] °  °Location: °[address] °Clinic Hours: °Mon-Thu 8a-5p °Services: °Most basic dental services including extractions and fillings. °Payment Options: °PAYMENT IS DUE AT THE TIME OF SERVICE. °Sliding scale, up to 50% off - bring proof if income or support. °Medicaid with dental option accepted. °Best way to get seen: °Call to schedule an appointment, can usually be seen within 2 weeks OR they will try to see walk-ins - show up at 8a or 2p (you may have to wait). °  °  °[facility] °919-245-2435 °[place] RESIDENTS ONLY °  °Location: °[facility], [address], [place],  [address] °Clinic Hours: By appointment only. °Monday - Thursday 8am-5pm, Friday 8am-12pm °Services: Cleanings, fillings, extractions. °Payment Options: °PAYMENT IS DUE AT THE TIME OF SERVICE. °Cash, Visa or MasterCard. Sliding scale - $30 minimum per service. °Best way to get seen: °Come in to office, complete packet and make an appointment - need proof of income °or support monies for each household member and proof of [place] residence. °Usually takes about a month to get in. °  °  °[facility] °[phone] °  °Location: °[address]   [place] °Clinic Hours: Walk-in Urgent Care Dental Services are offered Monday-Friday  mornings only. °The numbers of emergencies accepted daily is limited to the number of °providers available. °Maximum 15 - Mondays, Wednesdays & Thursdays °Maximum 10 - Tuesdays & Fridays °Services: °You do not need to be a [place] resident to be seen for a dental emergency. °Emergencies are defined as pain, swelling, abnormal bleeding, or dental trauma. Walkins will receive x-rays if needed. °NOTE: Dental cleaning is not an emergency. °Payment Options: °PAYMENT IS DUE AT THE TIME OF SERVICE. °Minimum co-pay is $40.00 for uninsured patients. °Minimum co-pay is $3.00 for Medicaid with dental coverage. °Dental Insurance is accepted and must be presented at time of visit. °Medicare does not cover dental. °Forms of payment: Cash, credit card, checks. °Best way to get seen: °If not previously registered with the clinic, walk-in dental registration begins at 7:15 am and is on a first come/first serve basis. °If previously registered with the clinic, call to make an appointment. °  °  °[facility] °[phone] °[place] RESIDENTS ONLY °  °Location: °[address], [place], [place] °Clinic Hours: °Mon-Thu 10a-2p °Services: Extractions only! °Payment Options: °FREE (donations accepted) - bring proof of income or support °Best way to get seen: °Call and schedule an appointment OR come at 8am on the 1st Monday of every month (except for holidays) when it is first come/first served. °  °  °[facility] °[phone] °  °Location: °[address], [place] °Clinic Hours: °Friday mornings °Services, Payment Options, Best way to get seen: °Call for info °

## 2016-06-03 NOTE — ED Provider Notes (Signed)
[facility] Emergency Department Provider Note   ____________________________________________   First MD Initiated Contact with Patient 06/03/16 1552     (approximate)  I have reviewed the triage vital signs and the nursing notes.   HISTORY  Chief Complaint Dental Pain    HPI Christina Cohen is a 30 y.o. female patient complaining of dental pain secondary to a fracture to what she broke recently. Patient is rating the pain as a 9/10. No palliative measures taken for this complaint. Patient has history of devitalized teeth has not seen a dentist. Patient has history of polysubstance dependence. She describes the pain as sharp.  History reviewed. No pertinent past medical history.  Patient Active Problem List   Diagnosis Date Noted  . Somnolence   . Thrombocytopenia (HCC) 12/24/2014  . Polysubstance dependence including opioid type drug, episodic abuse, with perceptual disturbance (HCC) 12/22/2014  . Endotracheally intubated   . Altered mental status 12/21/2014    Past Surgical History:  Procedure Laterality Date  . CESAREAN SECTION      Prior to Admission medications   Medication Sig Start Date End Date Taking? Authorizing Provider  amoxicillin (AMOXIL) 500 MG capsule Take 1 capsule (500 mg total) by mouth 3 (three) times daily. 06/03/16   Joni Reiningonald K Euclide Granito, PA-C  lidocaine (XYLOCAINE) 2 % solution Use as directed 5 mLs in the mouth or throat every 6 (six) hours as needed for mouth pain. 06/03/16   Joni Reiningonald K Jennyfer Nickolson, PA-C  naproxen (NAPROSYN) 500 MG tablet Take 1 tablet (500 mg total) by mouth 2 (two) times daily with a meal. 06/03/16   Joni Reiningonald K Deanthony Maull, PA-C    Allergies Review of patient's allergies indicates no known allergies.  Family History  Problem Relation Age of Onset  . Diabetes Maternal Grandmother   . Thyroid disease Maternal Grandmother     Social History Social History  Substance Use Topics  . Smoking status: Current Every Day Smoker      Packs/day: 1.00    Years: 10.00    Types: Cigarettes  . Smokeless tobacco: Never Used  . Alcohol use No    Review of Systems Constitutional: No fever/chills. Patient appears drowsy Eyes: No visual changes. ENT: No sore throat. Dental pain Cardiovascular: Denies chest pain. Respiratory: Denies shortness of breath. Gastrointestinal: No abdominal pain.  No nausea, no vomiting.  No diarrhea.  No constipation. Genitourinary: Negative for dysuria. Musculoskeletal: Negative for back pain. Skin: Negative for rash. Neurological: Negative for headaches, focal weakness or numbness. Psychiatric:History of polysubstance abuse   ____________________________________________   PHYSICAL EXAM:  VITAL SIGNS: ED Triage Vitals  Enc Vitals Group     BP 06/03/16 1514 100/68     Pulse Rate 06/03/16 1514 (!) 113     Resp 06/03/16 1514 18     Temp 06/03/16 1514 98.6 F (37 C)     Temp Source 06/03/16 1514 Oral     SpO2 06/03/16 1514 97 %     Weight 06/03/16 1515 108 lb (49 kg)     Height 06/03/16 1515 5\' 2"  (1.575 m)     Head Circumference --      Peak Flow --      Pain Score 06/03/16 1515 9     Pain Loc --      Pain Edu? --      Excl. in GC? --     Constitutional: Alert and oriented. Well appearing and in no acute distress. Eyes: Conjunctivae are normal. PERRL. EOMI. Head:  Atraumatic. Nose: No congestion/rhinnorhea. Mouth/Throat: Mucous membranes are moist.  Oropharynx non-erythematous. Neck: No stridor.  No cervical spine tenderness to palpation. Hematological/Lymphatic/Immunilogical: No cervical lymphadenopathy. }Cardiovascular: Normal rate, regular rhythm. Grossly normal heart sounds.  Good peripheral circulation. Respiratory: Normal respiratory effort.  No retractions. Lungs CTAB. Gastrointestinal: Soft and nontender. No distention. No abdominal bruits. No CVA tenderness. Musculoskeletal: No lower extremity tenderness nor edema.  No joint effusions. Neurologic:  Normal speech  and language. No gross focal neurologic deficits are appreciated. No gait instability. Skin:  Skin is warm, dry and intact. No rash noted. Psychiatric: Mood and affect are normal. Speech and behavior are normal.  ____________________________________________   LABS (all labs ordered are listed, but only abnormal results are displayed)  Labs Reviewed - No data to display ____________________________________________  EKG   ____________________________________________  RADIOLOGY   ____________________________________________   PROCEDURES  Procedure(s) performed: None  Procedures  Critical Care performed: No  ____________________________________________   INITIAL IMPRESSION / ASSESSMENT AND PLAN / ED COURSE  Pertinent labs & imaging results that were available during my care of the patient were reviewed by me and considered in my medical decision making (see chart for details).  Dental pain. Patient given discharge Instructions. Patient given nystatin to close follow care. Patient given a prescription for viscous lidocaine, ibuprofen, and amoxicillin.  Clinical Course     ____________________________________________   FINAL CLINICAL IMPRESSION(S) / ED DIAGNOSES  Final diagnoses:  Pain, dental      NEW MEDICATIONS STARTED DURING THIS VISIT:  New Prescriptions   AMOXICILLIN (AMOXIL) 500 MG CAPSULE    Take 1 capsule (500 mg total) by mouth 3 (three) times daily.   LIDOCAINE (XYLOCAINE) 2 % SOLUTION    Use as directed 5 mLs in the mouth or throat every 6 (six) hours as needed for mouth pain.   NAPROXEN (NAPROSYN) 500 MG TABLET    Take 1 tablet (500 mg total) by mouth 2 (two) times daily with a meal.     Note:  This document was prepared using Dragon voice recognition software and may include unintentional dictation errors.    Joni Reining, PA-C 06/03/16 1612    Sharman Cheek, MD 06/03/16 [phone]

## 2016-06-03 NOTE — ED Triage Notes (Signed)
Pt presents to ED c/o dental pain and swollen on lower left side of face. Right side with severe tooth pain due to tooth breaking off recently.

## 2016-06-03 NOTE — ED Notes (Signed)
States she developed some pain and swelling to left side of face couple of days ago  Thinks she has a bad tooth  Increased pain with swelling today

## 2017-05-21 ENCOUNTER — Emergency Department
Admission: EM | Admit: 2017-05-21 | Discharge: 2017-05-21 | Disposition: A | Discharge status: Home / Self Care | Payer: Self-pay | Attending: Emergency Medicine | Admitting: Emergency Medicine

## 2017-05-21 ENCOUNTER — Encounter: Payer: Self-pay | Admitting: Emergency Medicine

## 2017-05-21 DIAGNOSIS — R21 Rash and other nonspecific skin eruption: Secondary | ICD-10-CM | POA: Insufficient documentation

## 2017-05-21 DIAGNOSIS — Z5321 Procedure and treatment not carried out due to patient leaving prior to being seen by health care provider: Secondary | ICD-10-CM | POA: Insufficient documentation

## 2017-05-21 NOTE — ED Notes (Signed)
Pt has a tick bite on right lower abd and she is concerned about the rash that is around the area

## 2017-05-21 NOTE — ED Notes (Signed)
Pt left without being seen - she told this nurse that she was going to tell her grandmother to leave and she never returned

## 2017-05-21 NOTE — ED Triage Notes (Signed)
Patient states that she was bit by a tick a week ago and now has a rash to her abdomen.

## 2017-05-21 NOTE — ED Provider Notes (Cosign Needed)
Patient was triaged and placed in room. Prior to patient being evaluated by provider, patient eloped without informing staff or provider. No assessment or treatment provided by this provider.   Racheal Patches, PA-C 05/21/17 2213

## 2017-05-21 NOTE — ED Triage Notes (Signed)
States pulled a tick off her stomach last Sunday and now has small red dot with area around it

## 2018-02-12 ENCOUNTER — Emergency Department
Admission: EM | Admit: 2018-02-12 | Discharge: 2018-02-12 | Payer: No Typology Code available for payment source | Attending: Emergency Medicine | Admitting: Emergency Medicine

## 2018-02-12 ENCOUNTER — Encounter: Payer: Self-pay | Admitting: Emergency Medicine

## 2018-02-12 ENCOUNTER — Emergency Department: Payer: No Typology Code available for payment source

## 2018-02-12 DIAGNOSIS — Y9389 Activity, other specified: Secondary | ICD-10-CM | POA: Insufficient documentation

## 2018-02-12 DIAGNOSIS — Y999 Unspecified external cause status: Secondary | ICD-10-CM | POA: Insufficient documentation

## 2018-02-12 DIAGNOSIS — S0990XA Unspecified injury of head, initial encounter: Secondary | ICD-10-CM | POA: Diagnosis not present

## 2018-02-12 DIAGNOSIS — W2210XA Striking against or struck by unspecified automobile airbag, initial encounter: Secondary | ICD-10-CM | POA: Insufficient documentation

## 2018-02-12 DIAGNOSIS — Y929 Unspecified place or not applicable: Secondary | ICD-10-CM | POA: Insufficient documentation

## 2018-02-12 DIAGNOSIS — F1190 Opioid use, unspecified, uncomplicated: Secondary | ICD-10-CM

## 2018-02-12 DIAGNOSIS — F17210 Nicotine dependence, cigarettes, uncomplicated: Secondary | ICD-10-CM | POA: Insufficient documentation

## 2018-02-12 DIAGNOSIS — F1110 Opioid abuse, uncomplicated: Secondary | ICD-10-CM | POA: Insufficient documentation

## 2018-02-12 LAB — CBC
HCT: 36.7 % (ref 35.0–47.0)
Hemoglobin: 12.9 g/dL (ref 12.0–16.0)
MCH: 32.6 pg (ref 26.0–34.0)
MCHC: 35.1 g/dL (ref 32.0–36.0)
MCV: 92.8 fL (ref 80.0–100.0)
Platelets: 217 10*3/uL (ref 150–440)
RBC: 3.95 MIL/uL (ref 3.80–5.20)
RDW: 13.4 % (ref 11.5–14.5)
WBC: 6.4 10*3/uL (ref 3.6–11.0)

## 2018-02-12 LAB — BASIC METABOLIC PANEL
ANION GAP: 9 (ref 5–15)
BUN: 15 mg/dL (ref 6–20)
CO2: 25 mmol/L (ref 22–32)
Calcium: 9.3 mg/dL (ref 8.9–10.3)
Chloride: 102 mmol/L (ref 101–111)
Creatinine, Ser: 0.75 mg/dL (ref 0.44–1.00)
GFR calc non Af Amer: >60 mL/min (ref >60)
GLUCOSE: 115 mg/dL — AB (ref 65–99)
POTASSIUM: 3.6 mmol/L (ref 3.5–5.1)
Sodium: 136 mmol/L (ref 135–145)

## 2018-02-12 LAB — ETHANOL: Alcohol, Ethyl (B): <10 mg/dL (ref <10)

## 2018-02-12 LAB — POCT PREGNANCY, URINE: PREG TEST UR: NEGATIVE

## 2018-02-12 MED ORDER — NALOXONE HCL 4 MG/0.1ML NA LIQD
1.0000 | Freq: Once | NASAL | Status: AC
  Administered 2018-02-12: 1 via NASAL
  Filled 2018-02-12: qty 4

## 2018-02-12 NOTE — ED Notes (Signed)
Pt verbalized discharge instructions and has no questions at this time 

## 2018-02-12 NOTE — ED Notes (Signed)
Pt up to toilet 
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
## 2018-05-07 ENCOUNTER — Encounter: Payer: Self-pay | Admitting: *Deleted

## 2018-05-07 ENCOUNTER — Emergency Department: Payer: Self-pay

## 2018-05-07 ENCOUNTER — Other Ambulatory Visit: Payer: Self-pay

## 2018-05-07 ENCOUNTER — Emergency Department
Admission: EM | Admit: 2018-05-07 | Discharge: 2018-05-07 | Payer: Self-pay | Attending: Emergency Medicine | Admitting: Emergency Medicine

## 2018-05-07 DIAGNOSIS — Z79899 Other long term (current) drug therapy: Secondary | ICD-10-CM | POA: Insufficient documentation

## 2018-05-07 DIAGNOSIS — F17210 Nicotine dependence, cigarettes, uncomplicated: Secondary | ICD-10-CM | POA: Insufficient documentation

## 2018-05-07 DIAGNOSIS — T403X4A Poisoning by methadone, undetermined, initial encounter: Secondary | ICD-10-CM | POA: Insufficient documentation

## 2018-05-07 DIAGNOSIS — T50904A Poisoning by unspecified drugs, medicaments and biological substances, undetermined, initial encounter: Secondary | ICD-10-CM

## 2018-05-07 LAB — CBC
HCT: 36.3 % (ref 35.0–47.0)
Hemoglobin: 13 g/dL (ref 12.0–16.0)
MCH: 32.9 pg (ref 26.0–34.0)
MCHC: 35.7 g/dL (ref 32.0–36.0)
MCV: 92.2 fL (ref 80.0–100.0)
PLATELETS: 249 10*3/uL (ref 150–440)
RBC: 3.94 MIL/uL (ref 3.80–5.20)
RDW: 13.1 % (ref 11.5–14.5)
WBC: 6.4 10*3/uL (ref 3.6–11.0)

## 2018-05-07 LAB — URINALYSIS, COMPLETE (UACMP) WITH MICROSCOPIC
BACTERIA UA: NONE SEEN
Bilirubin Urine: NEGATIVE
Glucose, UA: NEGATIVE mg/dL
HGB URINE DIPSTICK: NEGATIVE
Ketones, ur: NEGATIVE mg/dL
Leukocytes, UA: NEGATIVE
Nitrite: NEGATIVE
PROTEIN: 30 mg/dL — AB
Specific Gravity, Urine: 1.033 — ABNORMAL HIGH (ref 1.005–1.030)
pH: 5 (ref 5.0–8.0)

## 2018-05-07 LAB — COMPREHENSIVE METABOLIC PANEL
ALBUMIN: 3.9 g/dL (ref 3.5–5.0)
ALT: 31 U/L (ref 0–44)
AST: 31 U/L (ref 15–41)
Alkaline Phosphatase: 97 U/L (ref 38–126)
Anion gap: 7 (ref 5–15)
BUN: 14 mg/dL (ref 6–20)
CALCIUM: 9.1 mg/dL (ref 8.9–10.3)
CO2: 27 mmol/L (ref 22–32)
CREATININE: 0.8 mg/dL (ref 0.44–1.00)
Chloride: 105 mmol/L (ref 98–111)
GFR calc Af Amer: >60 mL/min (ref >60)
GFR calc non Af Amer: >60 mL/min (ref >60)
GLUCOSE: 114 mg/dL — AB (ref 70–99)
Potassium: 3.6 mmol/L (ref 3.5–5.1)
SODIUM: 139 mmol/L (ref 135–145)
Total Bilirubin: 0.5 mg/dL (ref 0.3–1.2)
Total Protein: 7.3 g/dL (ref 6.5–8.1)

## 2018-05-07 LAB — URINE DRUG SCREEN, QUALITATIVE (ARMC ONLY)
Amphetamines, Ur Screen: NOT DETECTED
BENZODIAZEPINE, UR SCRN: POSITIVE — AB
Barbiturates, Ur Screen: NOT DETECTED
CANNABINOID 50 NG, UR ~~LOC~~: NOT DETECTED
Cocaine Metabolite,Ur ~~LOC~~: NOT DETECTED
MDMA (Ecstasy)Ur Screen: NOT DETECTED
Methadone Scn, Ur: POSITIVE — AB
Opiate, Ur Screen: NOT DETECTED
Phencyclidine (PCP) Ur S: NOT DETECTED
Tricyclic, Ur Screen: NOT DETECTED

## 2018-05-07 LAB — GLUCOSE, CAPILLARY: GLUCOSE-CAPILLARY: 88 mg/dL (ref 70–99)

## 2018-05-07 LAB — ETHANOL: Alcohol, Ethyl (B): <10 mg/dL (ref <10)

## 2018-05-07 LAB — POCT PREGNANCY, URINE: Preg Test, Ur: NEGATIVE

## 2018-05-07 MED ORDER — NALOXONE HCL 2 MG/2ML IJ SOSY
0.4000 mg | PREFILLED_SYRINGE | Freq: Once | INTRAMUSCULAR | Status: AC
  Administered 2018-05-07: 0.4 mg via INTRAVENOUS
  Filled 2018-05-07 (×2): qty 2

## 2018-05-07 MED ORDER — SODIUM CHLORIDE 0.9 % IV BOLUS
1000.0000 mL | Freq: Once | INTRAVENOUS | Status: AC
  Administered 2018-05-07: 1000 mL via INTRAVENOUS

## 2018-05-07 MED ORDER — THIAMINE HCL 100 MG/ML IJ SOLN
Freq: Once | INTRAVENOUS | Status: AC
  Administered 2018-05-07: 16:00:00 via INTRAVENOUS
  Filled 2018-05-07: qty 1000

## 2018-05-07 MED ORDER — ONDANSETRON 4 MG PO TBDP
4.0000 mg | ORAL_TABLET | Freq: Once | ORAL | Status: AC
  Administered 2018-05-07: 4 mg via ORAL
  Filled 2018-05-07: qty 1

## 2018-05-07 NOTE — ED Notes (Signed)
Patient transported to CT 

## 2018-05-07 NOTE — ED Notes (Signed)
Pt is in Police Custody and needs medical clearance.

## 2018-05-07 NOTE — ED Notes (Signed)
In and out cath done with melanie NT at bedside. Sterile technique maintained.

## 2018-05-07 NOTE — ED Notes (Signed)
Esign not working pt verbalized discharge instructions and has no questions at this time 
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
## 2018-05-07 NOTE — Discharge Instructions (Signed)
Please only take your prescriptions as they are prescribed.  Return to the emergency department if you develop severe pain, seizures, fever, or any other symptoms concerning to you.

## 2018-05-07 NOTE — ED Notes (Signed)
Pt able to get dressed and ambulated to wheelchair

## 2018-05-07 NOTE — ED Triage Notes (Signed)
Pt brought in by [place] police.  Pt reports taking methadone today.  Pt denies ETOH use.  Pt denies SI or HI.  Pt has slurred speech and sleepy in triage.  Pt calm and cooperative.

## 2018-12-06 ENCOUNTER — Emergency Department
Admission: EM | Admit: 2018-12-06 | Discharge: 2018-12-07 | Discharge status: Against Medical Advice | Payer: Self-pay | Attending: Emergency Medicine | Admitting: Emergency Medicine

## 2018-12-06 ENCOUNTER — Emergency Department: Payer: Self-pay

## 2018-12-06 DIAGNOSIS — F17210 Nicotine dependence, cigarettes, uncomplicated: Secondary | ICD-10-CM | POA: Insufficient documentation

## 2018-12-06 DIAGNOSIS — T50901A Poisoning by unspecified drugs, medicaments and biological substances, accidental (unintentional), initial encounter: Secondary | ICD-10-CM

## 2018-12-06 DIAGNOSIS — Z79899 Other long term (current) drug therapy: Secondary | ICD-10-CM | POA: Insufficient documentation

## 2018-12-06 DIAGNOSIS — T401X1A Poisoning by heroin, accidental (unintentional), initial encounter: Secondary | ICD-10-CM | POA: Insufficient documentation

## 2018-12-06 MED ORDER — SODIUM CHLORIDE 0.9 % IV BOLUS
1000.0000 mL | Freq: Once | INTRAVENOUS | Status: AC
  Administered 2018-12-07: 1000 mL via INTRAVENOUS
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
## 2018-12-07 LAB — URINE DRUG SCREEN, QUALITATIVE (ARMC ONLY)
Amphetamines, Ur Screen: NOT DETECTED
Barbiturates, Ur Screen: NOT DETECTED
Benzodiazepine, Ur Scrn: POSITIVE — AB
Cannabinoid 50 Ng, Ur ~~LOC~~: NOT DETECTED
Cocaine Metabolite,Ur ~~LOC~~: NOT DETECTED
MDMA (Ecstasy)Ur Screen: NOT DETECTED
Methadone Scn, Ur: NOT DETECTED
Opiate, Ur Screen: POSITIVE — AB
Phencyclidine (PCP) Ur S: NOT DETECTED
Tricyclic, Ur Screen: NOT DETECTED

## 2018-12-07 LAB — CBC WITH DIFFERENTIAL/PLATELET
Abs Immature Granulocytes: 0.03 10*3/uL (ref 0.00–0.07)
Basophils Absolute: 0 10*3/uL (ref 0.0–0.1)
Basophils Relative: 0 %
Eosinophils Absolute: 0.1 10*3/uL (ref 0.0–0.5)
Eosinophils Relative: 1 %
HCT: 38.9 % (ref 36.0–46.0)
Hemoglobin: 13.7 g/dL (ref 12.0–15.0)
Immature Granulocytes: 0 %
Lymphocytes Relative: 20 %
Lymphs Abs: 1.5 10*3/uL (ref 0.7–4.0)
MCH: 31.1 pg (ref 26.0–34.0)
MCHC: 35.2 g/dL (ref 30.0–36.0)
MCV: 88.2 fL (ref 80.0–100.0)
Monocytes Absolute: 0.5 10*3/uL (ref 0.1–1.0)
Monocytes Relative: 6 %
Neutro Abs: 5.3 10*3/uL (ref 1.7–7.7)
Neutrophils Relative %: 73 %
Platelets: 247 10*3/uL (ref 150–400)
RBC: 4.41 MIL/uL (ref 3.87–5.11)
RDW: 12.5 % (ref 11.5–15.5)
WBC: 7.5 10*3/uL (ref 4.0–10.5)
nRBC: 0 % (ref 0.0–0.2)

## 2018-12-07 LAB — COMPREHENSIVE METABOLIC PANEL
ALT: 83 U/L — ABNORMAL HIGH (ref 0–44)
AST: 44 U/L — ABNORMAL HIGH (ref 15–41)
Albumin: 4.1 g/dL (ref 3.5–5.0)
Alkaline Phosphatase: 88 U/L (ref 38–126)
Anion gap: 9 (ref 5–15)
BUN: 11 mg/dL (ref 6–20)
CO2: 24 mmol/L (ref 22–32)
Calcium: 9 mg/dL (ref 8.9–10.3)
Chloride: 103 mmol/L (ref 98–111)
Creatinine, Ser: 0.74 mg/dL (ref 0.44–1.00)
GFR calc Af Amer: >60 mL/min (ref >60)
GFR calc non Af Amer: >60 mL/min (ref >60)
Glucose, Bld: 101 mg/dL — ABNORMAL HIGH (ref 70–99)
Potassium: 3.7 mmol/L (ref 3.5–5.1)
Sodium: 136 mmol/L (ref 135–145)
Total Bilirubin: 0.5 mg/dL (ref 0.3–1.2)
Total Protein: 8 g/dL (ref 6.5–8.1)

## 2018-12-07 LAB — URINALYSIS, COMPLETE (UACMP) WITH MICROSCOPIC
Bacteria, UA: NONE SEEN
Bilirubin Urine: NEGATIVE
Glucose, UA: NEGATIVE mg/dL
Hgb urine dipstick: NEGATIVE
Ketones, ur: NEGATIVE mg/dL
Nitrite: NEGATIVE
Protein, ur: NEGATIVE mg/dL
Specific Gravity, Urine: 1.012 (ref 1.005–1.030)
pH: 6 (ref 5.0–8.0)

## 2018-12-07 LAB — SALICYLATE LEVEL: Salicylate Lvl: <7 mg/dL (ref 2.8–30.0)

## 2018-12-07 LAB — ACETAMINOPHEN LEVEL: Acetaminophen (Tylenol), Serum: <10 ug/mL — ABNORMAL LOW (ref 10–30)

## 2018-12-07 LAB — ETHANOL: Alcohol, Ethyl (B): <10 mg/dL (ref <10)

## 2018-12-07 LAB — POCT PREGNANCY, URINE: Preg Test, Ur: NEGATIVE

## 2018-12-13 ENCOUNTER — Encounter: Payer: Self-pay | Admitting: Emergency Medicine

## 2018-12-13 ENCOUNTER — Emergency Department
Admission: EM | Admit: 2018-12-13 | Discharge: 2018-12-13 | Discharge status: Against Medical Advice | Payer: Self-pay | Attending: Emergency Medicine | Admitting: Emergency Medicine

## 2018-12-13 DIAGNOSIS — Z79899 Other long term (current) drug therapy: Secondary | ICD-10-CM | POA: Insufficient documentation

## 2018-12-13 DIAGNOSIS — F17210 Nicotine dependence, cigarettes, uncomplicated: Secondary | ICD-10-CM | POA: Insufficient documentation

## 2018-12-13 DIAGNOSIS — T401X4A Poisoning by heroin, undetermined, initial encounter: Secondary | ICD-10-CM | POA: Insufficient documentation

## 2018-12-13 LAB — COMPREHENSIVE METABOLIC PANEL
ALT: 98 U/L — ABNORMAL HIGH (ref 0–44)
AST: 56 U/L — ABNORMAL HIGH (ref 15–41)
Albumin: 4.5 g/dL (ref 3.5–5.0)
Alkaline Phosphatase: 96 U/L (ref 38–126)
Anion gap: 12 (ref 5–15)
BUN: 10 mg/dL (ref 6–20)
CO2: 23 mmol/L (ref 22–32)
Calcium: 9 mg/dL (ref 8.9–10.3)
Chloride: 100 mmol/L (ref 98–111)
Creatinine, Ser: 0.74 mg/dL (ref 0.44–1.00)
GFR calc Af Amer: >60 mL/min (ref >60)
GFR calc non Af Amer: >60 mL/min (ref >60)
Glucose, Bld: 133 mg/dL — ABNORMAL HIGH (ref 70–99)
Potassium: 3.1 mmol/L — ABNORMAL LOW (ref 3.5–5.1)
Sodium: 135 mmol/L (ref 135–145)
Total Bilirubin: 0.7 mg/dL (ref 0.3–1.2)
Total Protein: 8.3 g/dL — ABNORMAL HIGH (ref 6.5–8.1)

## 2018-12-13 LAB — CBC
HCT: 41.1 % (ref 36.0–46.0)
Hemoglobin: 14.2 g/dL (ref 12.0–15.0)
MCH: 30.7 pg (ref 26.0–34.0)
MCHC: 34.5 g/dL (ref 30.0–36.0)
MCV: 89 fL (ref 80.0–100.0)
Platelets: 266 10*3/uL (ref 150–400)
RBC: 4.62 MIL/uL (ref 3.87–5.11)
RDW: 12.9 % (ref 11.5–15.5)
WBC: 5.3 10*3/uL (ref 4.0–10.5)
nRBC: 0 % (ref 0.0–0.2)

## 2018-12-13 LAB — ETHANOL: Alcohol, Ethyl (B): <10 mg/dL (ref <10)

## 2018-12-13 NOTE — ED Notes (Signed)
Pt stated that she had to leave because her grandmother was on the way to come get her. Dr. Alphonzo Lemmings made aware. Stated to get pt to sign AMA form.
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
## 2018-12-13 NOTE — ED Notes (Signed)
AMA has been signed. Pt is up in room getting dressed by herself. Pt is A&OX4 and has a steady gait.

## 2018-12-13 NOTE — ED Provider Notes (Signed)
[facility] Emergency Department Provider Note  ____________________________________________   I have reviewed the triage vital signs and the nursing notes. Where available I have reviewed prior notes and, if possible and indicated, outside hospital notes.    HISTORY  Chief Complaint Drug Overdose    HPI Christina Cohen is a 33 y.o. female  Who presents after heroin overdose, she was given Narcan she woke up she was not trying to kill her self she has had this before she does not want to be evaluated further, she does not want counseling she is not suicidal she wants to leave as soon as she can although she consents to stay for a while so we can observe her.     No past medical history on file.  Patient Active Problem List   Diagnosis Date Noted  . Somnolence   . Thrombocytopenia (HCC) 12/24/2014  . Polysubstance dependence including opioid type drug, episodic abuse, with perceptual disturbance (HCC) 12/22/2014  . Endotracheally intubated   . Altered mental status 12/21/2014    Past Surgical History:  Procedure Laterality Date  . CESAREAN SECTION      Prior to Admission medications   Medication Sig Start Date End Date Taking? Authorizing Provider  amoxicillin (AMOXIL) 500 MG capsule Take 1 capsule (500 mg total) by mouth 3 (three) times daily. 06/03/16   Joni Reining, PA-C  lidocaine (XYLOCAINE) 2 % solution Use as directed 5 mLs in the mouth or throat every 6 (six) hours as needed for mouth pain. 06/03/16   Joni Reining, PA-C  naproxen (NAPROSYN) 500 MG tablet Take 1 tablet (500 mg total) by mouth 2 (two) times daily with a meal. 06/03/16   Joni Reining, PA-C    Allergies Patient has no known allergies.  Family History  Problem Relation Age of Onset  . Diabetes Maternal Grandmother   . Thyroid disease Maternal Grandmother     Social History Social History   Tobacco Use  . Smoking status: Current Every Day Smoker    Packs/day:  1.00    Years: 10.00    Pack years: 10.00    Types: Cigarettes  . Smokeless tobacco: Never Used  Substance Use Topics  . Alcohol use: No    Alcohol/week: 0.0 standard drinks  . Drug use: Yes    Review of Systems Constitutional: No fever/chills Eyes: No visual changes. ENT: No sore throat. No stiff neck no neck pain Cardiovascular: Denies chest pain. Respiratory: Denies shortness of breath. Gastrointestinal:   no vomiting.  No diarrhea.  No constipation. Genitourinary: Negative for dysuria. Musculoskeletal: Negative lower extremity swelling Skin: Negative for rash. Neurological: Negative for severe headaches, focal weakness or numbness.   ____________________________________________   PHYSICAL EXAM:  VITAL SIGNS: ED Triage Vitals  Enc Vitals Group     BP 12/13/18 2053 (!) 135/94     Pulse Rate 12/13/18 2053 96     Resp 12/13/18 2102 15     Temp 12/13/18 2059 98.2 F (36.8 C)     Temp Source 12/13/18 2059 Oral     SpO2 12/13/18 2050 100 %     Weight 12/13/18 2054 139 lb 15.9 oz (63.5 kg)     Height 12/13/18 2054 5\' 2"  (1.575 m)     Head Circumference --      Peak Flow --      Pain Score 12/13/18 2054 0     Pain Loc --      Pain Edu? --  Excl. in GC? --     Constitutional: Alert and oriented. Well appearing and in no acute distress. Eyes: Conjunctivae are normal Head: Atraumatic HEENT: No congestion/rhinnorhea. Mucous membranes are moist.  Oropharynx non-erythematous Neck:   Nontender with no meningismus, no masses, no stridor Cardiovascular: Normal rate, regular rhythm. Grossly normal heart sounds.  Good peripheral circulation. Respiratory: Normal respiratory effort.  No retractions. Lungs CTAB. Abdominal: Soft and nontender. No distention. No guarding no rebound Back:  There is no focal tenderness or step off.  there is no midline tenderness there are no lesions noted. there is no CVA tenderness  Musculoskeletal: No lower extremity tenderness, no upper  extremity tenderness. No joint effusions, no DVT signs strong distal pulses no edema Neurologic:  Normal speech and language. No gross focal neurologic deficits are appreciated.  Skin:  Skin is warm, dry and intact. No rash noted. Psychiatric: Mood and affect are normal. Speech and behavior are normal.  ____________________________________________   LABS (all labs ordered are listed, but only abnormal results are displayed)  Labs Reviewed  COMPREHENSIVE METABOLIC PANEL - Abnormal; Notable for the following components:      Result Value   Potassium 3.1 (*)    Glucose, Bld 133 (*)    Total Protein 8.3 (*)    AST 56 (*)    ALT 98 (*)    All other components within normal limits  ETHANOL  CBC  URINE DRUG SCREEN, QUALITATIVE ([facility] ONLY)    Pertinent labs  results that were available during my care of the patient were reviewed by me and considered in my medical decision making (see chart for details). ____________________________________________  EKG  I personally interpreted any EKGs ordered by me or triage  ____________________________________________  RADIOLOGY  Pertinent labs & imaging results that were available during my care of the patient were reviewed by me and considered in my medical decision making (see chart for details). If possible, patient and/or family made aware of any abnormal findings.  No results found. ____________________________________________    PROCEDURES  Procedure(s) performed: None  Procedures  Critical Care performed: None  ____________________________________________   INITIAL IMPRESSION / ASSESSMENT AND PLAN / ED COURSE  Pertinent labs & imaging results that were available during my care of the patient were reviewed by me and considered in my medical decision making (see chart for details).  Here after heroin accidental overdose, she states she just fell asleep she states she was not completely out of it, however Narcan was  administered.  I advised her to stay in the hospital long enough for us to observe her I did advise that the Narcan will could wear off and that she could have a rebound pathology she refused to stay and after a brief period of time signed out AGAINST MEDICAL ADVICE she was awake and alert I feel able to make this decision had a capacity to understand the risk benefits and alternatives of departure, refused therapy or counseling, has no heart murmur or fever and was in no acute distress blood work was reassuring we therefore allowed her to sign out AMA.  And she can come back if she feels the need.    ____________________________________________   FINAL CLINICAL IMPRESSION(S) / ED DIAGNOSES  Final diagnoses:  None      This chart was dictated using voice recognition software.  Despite best efforts to proofread,  errors can occur which can change meaning.      Jeanmarie PlantMcShane, Cyndra Feinberg A, MD 12/13/18 2234

## 2019-01-04 ENCOUNTER — Emergency Department: Admission: EM | Admit: 2019-01-04 | Discharge: 2019-01-04 | Discharge status: Against Medical Advice | Payer: Self-pay

## 2019-04-25 ENCOUNTER — Other Ambulatory Visit: Payer: Self-pay

## 2019-04-25 ENCOUNTER — Emergency Department
Admission: EM | Admit: 2019-04-25 | Discharge: 2019-04-26 | Disposition: A | Discharge status: Home / Self Care | Payer: Self-pay | Attending: Student | Admitting: Student

## 2019-04-25 DIAGNOSIS — F1910 Other psychoactive substance abuse, uncomplicated: Secondary | ICD-10-CM

## 2019-04-25 DIAGNOSIS — R4182 Altered mental status, unspecified: Secondary | ICD-10-CM | POA: Diagnosis present

## 2019-04-25 DIAGNOSIS — R400 Somnolence: Secondary | ICD-10-CM | POA: Diagnosis present

## 2019-04-25 DIAGNOSIS — Z20828 Contact with and (suspected) exposure to other viral communicable diseases: Secondary | ICD-10-CM | POA: Insufficient documentation

## 2019-04-25 DIAGNOSIS — F19288 Other psychoactive substance dependence with other psychoactive substance-induced disorder: Secondary | ICD-10-CM

## 2019-04-25 DIAGNOSIS — Z978 Presence of other specified devices: Secondary | ICD-10-CM | POA: Diagnosis present

## 2019-04-25 DIAGNOSIS — F1120 Opioid dependence, uncomplicated: Secondary | ICD-10-CM | POA: Insufficient documentation

## 2019-04-25 DIAGNOSIS — D696 Thrombocytopenia, unspecified: Secondary | ICD-10-CM | POA: Diagnosis present

## 2019-04-25 DIAGNOSIS — F17210 Nicotine dependence, cigarettes, uncomplicated: Secondary | ICD-10-CM | POA: Insufficient documentation

## 2019-04-25 DIAGNOSIS — F331 Major depressive disorder, recurrent, moderate: Secondary | ICD-10-CM | POA: Insufficient documentation

## 2019-04-25 LAB — COMPREHENSIVE METABOLIC PANEL
ALT: 19 U/L (ref 0–44)
AST: 42 U/L — ABNORMAL HIGH (ref 15–41)
Albumin: 3.2 g/dL — ABNORMAL LOW (ref 3.5–5.0)
Alkaline Phosphatase: 112 U/L (ref 38–126)
Anion gap: 12 (ref 5–15)
BUN: 22 mg/dL — ABNORMAL HIGH (ref 6–20)
CO2: 23 mmol/L (ref 22–32)
Calcium: 9.3 mg/dL (ref 8.9–10.3)
Chloride: 95 mmol/L — ABNORMAL LOW (ref 98–111)
Creatinine, Ser: 1.06 mg/dL — ABNORMAL HIGH (ref 0.44–1.00)
GFR calc Af Amer: >60 mL/min (ref >60)
GFR calc non Af Amer: >60 mL/min (ref >60)
Glucose, Bld: 112 mg/dL — ABNORMAL HIGH (ref 70–99)
Potassium: 4.1 mmol/L (ref 3.5–5.1)
Sodium: 130 mmol/L — ABNORMAL LOW (ref 135–145)
Total Bilirubin: 1.2 mg/dL (ref 0.3–1.2)
Total Protein: 8 g/dL (ref 6.5–8.1)

## 2019-04-25 LAB — CBC
HCT: 35.2 % — ABNORMAL LOW (ref 36.0–46.0)
Hemoglobin: 11.8 g/dL — ABNORMAL LOW (ref 12.0–15.0)
MCH: 29 pg (ref 26.0–34.0)
MCHC: 33.5 g/dL (ref 30.0–36.0)
MCV: 86.5 fL (ref 80.0–100.0)
Platelets: 198 10*3/uL (ref 150–400)
RBC: 4.07 MIL/uL (ref 3.87–5.11)
RDW: 13.8 % (ref 11.5–15.5)
WBC: 9.5 10*3/uL (ref 4.0–10.5)
nRBC: 0 % (ref 0.0–0.2)

## 2019-04-25 LAB — URINE DRUG SCREEN, QUALITATIVE (ARMC ONLY)
Amphetamines, Ur Screen: POSITIVE — AB
Barbiturates, Ur Screen: NOT DETECTED
Benzodiazepine, Ur Scrn: POSITIVE — AB
Cannabinoid 50 Ng, Ur ~~LOC~~: NOT DETECTED
Cocaine Metabolite,Ur ~~LOC~~: NOT DETECTED
MDMA (Ecstasy)Ur Screen: NOT DETECTED
Methadone Scn, Ur: POSITIVE — AB
Opiate, Ur Screen: POSITIVE — AB
Phencyclidine (PCP) Ur S: NOT DETECTED
Tricyclic, Ur Screen: NOT DETECTED

## 2019-04-25 LAB — ACETAMINOPHEN LEVEL: Acetaminophen (Tylenol), Serum: <10 ug/mL — ABNORMAL LOW (ref 10–30)

## 2019-04-25 LAB — PREGNANCY, URINE: Preg Test, Ur: NEGATIVE

## 2019-04-25 LAB — SALICYLATE LEVEL: Salicylate Lvl: <7 mg/dL (ref 2.8–30.0)

## 2019-04-25 LAB — ETHANOL: Alcohol, Ethyl (B): <10 mg/dL (ref <10)

## 2019-04-25 LAB — SARS CORONAVIRUS 2 BY RT PCR (HOSPITAL ORDER, PERFORMED IN ~~LOC~~ HOSPITAL LAB): SARS Coronavirus 2: NEGATIVE

## 2019-04-25 MED ORDER — NALOXONE HCL 2 MG/2ML IJ SOSY
0.4000 mg | PREFILLED_SYRINGE | Freq: Once | INTRAMUSCULAR | Status: AC
  Administered 2019-04-25: 17:00:00 0.4 mg via INTRAVENOUS

## 2019-04-25 MED ORDER — NALOXONE HCL 2 MG/2ML IJ SOSY
PREFILLED_SYRINGE | INTRAMUSCULAR | Status: AC
  Filled 2019-04-25: qty 2

## 2019-04-25 MED ORDER — SODIUM CHLORIDE 0.9 % IV BOLUS
1000.0000 mL | Freq: Once | INTRAVENOUS | Status: AC
  Administered 2019-04-25: 1000 mL via INTRAVENOUS

## 2019-04-25 NOTE — ED Notes (Signed)
Bedding changed (wet).  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

## 2019-04-25 NOTE — ED Notes (Signed)
Found insulin syringe with drugs in it in personal belongings.  Wasted drugs with [employer] account planner into [employer].  Security aware.

## 2019-04-25 NOTE — ED Triage Notes (Signed)
Pt presents to ED via [employer], per [employer] pt IVC for behaviors pt presenting to ED with. Pt denies recent drug use, states last IV drug use was in Feb, Pt unable to stay awake in triage, pt slumped over in wheelchair, pt needs constant redirection to stay awake.

## 2019-04-25 NOTE — BH Assessment (Signed)
TTS waiting for medical clearance before assessing patient. Pt unable to stay awake to participate in assessment.

## 2019-04-25 NOTE — ED Notes (Signed)
Key locked in pyxis from security for save. Key number [id number]

## 2019-04-25 NOTE — ED Notes (Signed)
Pt still sleeping.  Responsive to painful stimuli.

## 2019-04-25 NOTE — ED Provider Notes (Addendum)
[facility] Emergency Department Provider Note  ____________________________________________   First MD Initiated Contact with Patient 04/25/19 1332     (approximate)  I have reviewed the triage vital signs and the nursing notes.  History  Chief Complaint Drug Overdose    HPI Christina Cohen is a 33 y.o. female with a history of drug abuse, who presents to the ED via police under IVC, concern for potential overdose.  Per IVC paperwork "respondent's husband recently passed away and respondent stated that losing her husband makes her feel like she wants to overdose".  On exam, patient is somnolent, but does arouse to painful stimuli.  She does not participate in history.  She denies any ingestion.         Past Medical Hx History reviewed. No pertinent past medical history.  Problem List Patient Active Problem List   Diagnosis Date Noted  . Somnolence   . Thrombocytopenia (HCC) 12/24/2014  . Polysubstance dependence including opioid type drug, episodic abuse, with perceptual disturbance (HCC) 12/22/2014  . Endotracheally intubated   . Altered mental status 12/21/2014    Past Surgical Hx Past Surgical History:  Procedure Laterality Date  . CESAREAN SECTION      Medications Prior to Admission medications   Not on File    Allergies Patient has no known allergies.  Family Hx Family History  Problem Relation Age of Onset  . Diabetes Maternal Grandmother   . Thyroid disease Maternal Grandmother     Social Hx Social History   Tobacco Use  . Smoking status: Current Every Day Smoker    Packs/day: 1.00    Years: 10.00    Pack years: 10.00    Types: Cigarettes  . Smokeless tobacco: Never Used  Substance Use Topics  . Alcohol use: No    Alcohol/week: 0.0 standard drinks  . Drug use: Yes     Review of Systems  Unable to obtain due to patient's clinical status.   Physical Exam  Vital Signs: ED Triage Vitals  Enc Vitals Group      BP 04/25/19 1320 101/62     Pulse Rate 04/25/19 1320 (!) 119     Resp 04/25/19 1320 (!) 36     Temp 04/25/19 1320 99.6 F (37.6 C)     Temp Source 04/25/19 1320 Oral     SpO2 04/25/19 1320 93 %     Weight 04/25/19 1334 120 lb (54.4 kg)     Height 04/25/19 1334 5\' 4"  (1.626 m)     Head Circumference --      Peak Flow --      Pain Score 04/25/19 1330 Asleep     Pain Loc --      Pain Edu? --      Excl. in GC? --     Constitutional: Somnolent, does arouse to painful stimuli.  Eyes: Conjunctivae clear. Sclera anicteric. Head: Normocephalic. Atraumatic. Nose: No congestion. No rhinorrhea. Mouth/Throat: Mucous membranes are dry.  Neck: No stridor.   Cardiovascular: Initially tachycardic, resolved on recheck, regular rhythm. No murmurs. Extremities well perfused. Respiratory: Normal respiratory effort.  Lungs CTAB. Gastrointestinal: Soft and non-tender. No distention.  Musculoskeletal: No lower extremity edema. Neurologic:  Normal speech and language. No gross focal neurologic deficits are appreciated.  Skin: Skin is warm, dry and intact. No rash noted. Psychiatric: Sleepy, denies ingestion. Limited participation.  EKG  N/A    Radiology  N/A   Procedures  Procedure(s) performed (including critical care):  Procedures  Initial Impression / Assessment and Plan / ED Course  33 y.o. female who presents to the ED under IVC, with concern for overdose.  On exam, she is somnolent, but arouses to painful stimuli.  Normal work of breathing, satting 95% and above. No evidence of trauma.  Unclear at this point what she may have overdosed on, (suspect opiates given her history) but given she is otherwise stable from a respiratory perspective, will hold on Narcan for now and continue to monitor closely and administer as needed.  Will obtain screening labs, continue to monitor, consult psychiatry and TTS.    Final Clinical Impression(s) / ED Diagnosis  Final diagnoses:   Involuntary commitment  Substance abuse [facility])     Note:  This document was prepared using Dragon voice recognition software and may include unintentional dictation errors.     Lilia Pro., MD 04/25/19 [phone]

## 2019-04-25 NOTE — ED Notes (Signed)
Pt was seen pulling the monitor cords from the monitor.  When asked what she needed and why she was pulling the cords, she stated she wants to speak to someone.  Pt pulled all her monitor leads and wires off of her, her b/p cuff is on the floor as well as her O2 sat finger probe.  Pts gown was covered in fresh blood as well as her arms and blankets.  She states she pulled off the gauze dressing from her blood stick and she was actively bleeding from the draw site on her left forearm.  Pts was given clean gown, blankets and her arm was cleaned and redressed.   Pt asking for her phone and her clothes and to go out for a smoke..  Explained to pt why she could not have those items and she could not leave for a cigarette.  RN ntified

## 2019-04-25 NOTE — ED Notes (Signed)
Pt has one bra, pair of sandal, one pair of pants, one tank top.  One bag with hair dryer, toiletries, cigarettes, gabapentin, earphones, one hairtie, two rings, one belly ring. One cell phone.  4 cards sent to safe to be locked up.

## 2019-04-25 NOTE — ED Notes (Signed)
Pt moaning and moving after narcan. Opened eyes and coughing/gagging. No vomiting. Pt wakes a little easier but still falls asleep and unable to answer orientation questions.

## 2019-04-26 ENCOUNTER — Encounter: Payer: Self-pay | Admitting: Behavioral Health

## 2019-04-26 DIAGNOSIS — F19288 Other psychoactive substance dependence with other psychoactive substance-induced disorder: Secondary | ICD-10-CM

## 2019-04-26 NOTE — ED Notes (Signed)
Attempting to call pts dad at this time. Pt has cell phone and is attempting to call herself to see if father answers known number. Voicemail is not set up at this time for RN to leave message.

## 2019-04-26 NOTE — BH Assessment (Addendum)
Assessment Note  Christina Cohen is an 33 y.o. female.  The pt came in under IVC after it was suspected that she overdosed on substances.  The pt denies any SA use since November 2019.  However, a RN found drugs in a syringe in the pt's belongings.  Her UDS was positive for amphetamines, opiates, benzodiazepines, and methadone.  She denies SI.  She stated she was having a bad day today, because the one year anniversary of her husband's death is approaching.  She denies SI.  She is currently going to [place] and her next appointment is next week.  The pt stated she lives with her family.  She reported she is supposed to start working at [employer] Friday.  She denies self harm, HI, legal issues, history of abuse and hallucinations.  She reports she is sleeping and eating OK.  Pt is dressed in scrubs. She is alert and oriented x4. Pt speaks in a clear tone, at moderate volume and normal pace. Eye contact is good. Pt's mood is flat. Thought process is coherent and relevant. There is no indication Pt is currently responding to internal stimuli or experiencing delusional thought content.?Pt was cooperative throughout assessment.    Diagnosis: F33.1 Major depressive disorder, Recurrent episode, Moderate F11.20 Opioid use disorder, Moderate  Past Medical History: History reviewed. No pertinent past medical history.  Past Surgical History:  Procedure Laterality Date  . CESAREAN SECTION      Family History:  Family History  Problem Relation Age of Onset  . Diabetes Maternal Grandmother   . Thyroid disease Maternal Grandmother     Social History:  reports that she has been smoking cigarettes. She has a 10.00 pack-year smoking history. She has never used smokeless tobacco. She reports current drug use. She reports that she does not drink alcohol.  Additional Social History:  Alcohol / Drug Use Pain Medications: See MAR Prescriptions: See MAR Over the Counter: See MAR History of alcohol / drug  use?: Yes Longest period of sobriety (when/how long): one year Substance #1 Name of Substance 1: heroin 1 - Age of First Use: 29 1 - Amount (size/oz): "one bag" 1 - Frequency: daily 1 - Last Use / Amount: 06/2018  CIWA: CIWA-Ar BP: 125/78 Pulse Rate: (!) 104 COWS:    Allergies: No Known Allergies  Home Medications: (Not in a hospital admission)   OB/GYN Status:  No LMP recorded. Patient has had an ablation.  General Assessment Data Location of Assessment: [facility] ED TTS Assessment: In system Is this a Tele or Face-to-Face Assessment?: Face-to-Face Is this an Initial Assessment or a Re-assessment for this encounter?: Initial Assessment Patient Accompanied by:: N/A Language Other than English: No Living Arrangements: Other (Comment)(house) What gender do you identify as?: Female Marital status: Widowed Elwin Sleight name: Sherral Hammers Pregnancy Status: No Living Arrangements: Other relatives Can pt return to current living arrangement?: Yes Admission Status: Involuntary Petitioner: ED Attending Is patient capable of signing voluntary admission?: No Referral Source: Self/Family/Friend Insurance type: Self Pay     Crisis Care Plan Living Arrangements: Other relatives Legal Guardian: Other:(Self) Name of Psychiatrist: [facility] Name of Therapist: [facility]  Education Status Is patient currently in school?: No Is the patient employed, unemployed or receiving disability?: Unemployed  Risk to self with the past 6 months Suicidal Ideation: No Has patient been a risk to self within the past 6 months prior to admission? : No Suicidal Intent: No Has patient had any suicidal intent within the past 6 months prior to  admission? : No Is patient at risk for suicide?: No Suicidal Plan?: No Has patient had any suicidal plan within the past 6 months prior to admission? : No Access to Means: No What has been your use of drugs/alcohol within the last 12 months?: past use of heroin Previous  Attempts/Gestures: No How many times?: 0 Other Self Harm Risks: pt denies Triggers for Past Attempts: None known Intentional Self Injurious Behavior: None Family Suicide History: No Recent stressful life event(s): Loss (Comment)(aniversary of her husband's death) Persecutory voices/beliefs?: No Depression: Yes Depression Symptoms: Tearfulness, Feeling worthless/self pity, Loss of interest in usual pleasures, Despondent Substance abuse history and/or treatment for substance abuse?: Yes Suicide prevention information given to non-admitted patients: Yes  Risk to Others within the past 6 months Homicidal Ideation: No Does patient have any lifetime risk of violence toward others beyond the six months prior to admission? : No Thoughts of Harm to Others: No Current Homicidal Intent: No Current Homicidal Plan: No Access to Homicidal Means: No Identified Victim: pt denies History of harm to others?: No Assessment of Violence: None Noted Violent Behavior Description: pt denies Does patient have access to weapons?: No Criminal Charges Pending?: Yes Describe Pending Criminal Charges: possession of drug paraphernalia Does patient have a court date: Yes Court Date: 06/27/19 Is patient on probation?: Unknown  Psychosis Hallucinations: None noted Delusions: None noted  Mental Status Report Appearance/Hygiene: Unremarkable, In hospital gown Eye Contact: Fair Motor Activity: Unremarkable Speech: Soft, Slow Level of Consciousness: Drowsy Mood: Depressed Affect: Depressed Anxiety Level: None Thought Processes: Coherent, Relevant Judgement: Partial Orientation: Person, Place, Time, Situation Obsessive Compulsive Thoughts/Behaviors: None  Cognitive Functioning Concentration: Normal Memory: Recent Intact, Remote Intact Is patient IDD: No Insight: Fair Impulse Control: Fair Appetite: Good Have you had any weight changes? : No Change Sleep: No Change Total Hours of Sleep:  8 Vegetative Symptoms: None  ADLScreening [facility] Assessment Services) Patient's cognitive ability adequate to safely complete daily activities?: Yes Patient able to express need for assistance with ADLs?: Yes Independently performs ADLs?: Yes (appropriate for developmental age)  Prior Inpatient Therapy Prior Inpatient Therapy: No  Prior Outpatient Therapy Prior Outpatient Therapy: Yes Prior Therapy Dates: current Prior Therapy Facilty/Provider(s): [facility] Reason for Treatment: depression and SA use Does patient have an ACCT team?: No Does patient have Intensive In-House Services?  : No Does patient have [facility] services? : No Does patient have P4CC services?: No  ADL Screening (condition at time of admission) Patient's cognitive ability adequate to safely complete daily activities?: Yes Patient able to express need for assistance with ADLs?: Yes Independently performs ADLs?: Yes (appropriate for developmental age)       Abuse/Neglect Assessment (Assessment to be complete while patient is alone) Abuse/Neglect Assessment Can Be Completed: Yes Physical Abuse: Denies Verbal Abuse: Denies Sexual Abuse: Denies Exploitation of patient/patient's resources: Denies Self-Neglect: Denies Values / Beliefs Cultural Requests During Hospitalization: None Spiritual Requests During Hospitalization: None Consults Spiritual Care Consult Needed: No Social Work Consult Needed: No            Disposition:  Disposition Initial Assessment Completed for this Encounter: Yes  On Site Evaluation by:   Reviewed with Physician:    Ottis StainGarvin, Taiven Greenley Jermaine 04/26/2019 2:39 AM

## 2019-04-26 NOTE — ED Notes (Addendum)
Pt responding to voice, still very groggy. Pt asked what time it was, for more water and fell back asleep.

## 2019-04-26 NOTE — BH Assessment (Signed)
TTS has made several attempts to obtain collateral information from pt's father Christina Cohen: [phone] -  No voicemail box setup/unable to leave HIPAA compliant voicemail) and pt's grandmother Christina Cohen: [phone]). Attempts to contact pt's natural supports were all unsuccessful.  Will attempt to obtain collateral information for safety and discharge planning.

## 2019-04-26 NOTE — ED Notes (Signed)
Pts father reports he can come to pick up patient in the next 10 minutes. Pt calm and dressing back into cloths at this time and then this RN will walk with patient outside to meet pts father.

## 2019-04-26 NOTE — BH Assessment (Addendum)
TTS spoke directly to pt's father Grayland Ormond: [phone]) who reports pt is not a danger to herself and she has never harmed herself.  He agreed to p/u pt from ED before he goes to work.   This information was relayed to pt's nurse and EDP - Dr. Quentin Cornwall.

## 2019-04-26 NOTE — ED Provider Notes (Signed)
The patient has been evaluated at bedside by psychiatry.  Patient is clinically stable.  Not felt to be a danger to self or others.  No SI or Hi.  No indication for inpatient psychiatric admission at this time.  Appropriate for continued outpatient therapy.    Merlyn Lot, MD 04/26/19 1451

## 2019-04-26 NOTE — ED Provider Notes (Signed)
-----------------------------------------   6:44 AM on 04/26/2019 -----------------------------------------   Blood pressure 125/78, pulse 95, temperature 99.6 F (37.6 C), temperature source Oral, resp. rate 20, height 1.626 m (5\' 4" ), weight 54.4 kg, SpO2 97 %.  The patient is resting calmly at this time.  She was reassessed by staff and psychiatry several times during the night but continued to be to groggy or somnolent to participate in psychiatric evaluation.  Her airway is not at risk but psychiatry was unable to adequately assess her so we will reassess her today.  No acute or emergent medical conditions other than the persistent somnolence which may be due to not only the reported overdose but the fact that we were attempting to assess her during the night.   Hinda Kehr, MD 04/26/19 [phone]

## 2019-04-26 NOTE — ED Notes (Signed)
Pt visualized resting with eyes closed. Will continue to monitor for further needs.

## 2019-04-26 NOTE — Consult Note (Signed)
[facility] Psych ED Discharge  04/26/2019 2:56 PM Christina Cohen  MRN:  [id number] Principal Problem: Polysubstance dependence including opioid type drug, episodic abuse, with perceptual disturbance [facility]) Discharge Diagnoses: Principal Problem:   Polysubstance dependence including opioid type drug, episodic abuse, with perceptual disturbance (Christina Cohen) Active Problems:   Thrombocytopenia (HCC)   Somnolence  Subjective: "I'm good."  Denies suicidal/homicidal ideations, hallucinations, and withdrawal symptoms.  Reports it is her daughter's birthday who lives with her parents and she wants to go spend it with her.  Patient seen and evaluated in the ED face-face by this provider.  Polysubstance use with accidental drug overdoses in the past.  No psychiatric admissions.  Encouraged drug rehab but patient does not acknowledge a problem.  She does go to [facility] for her care but feel a long-term rehab would be beneficial.  Patient does not concur.  No threat at this time and family, father (below), does not feel she is a risk.  Christina Cohen reviewed this client and concurs with the plan.  Father called for collateral, he denies any prior or recent suicidal ideations.  He has no concerns about her trying to harm herself.  Her father wants to come get her from the ED.  Per admission with Christina Cohen "I am not in the mood to talk right now." Christina Cohen is a 33 y.o. female patient presented to [facility] ED,  under involuntary commitment status (IVC). After it was suspected that she overdosed on substances, it was reported that the patient-nurse found drugs in a syringe in her belongings. Her UDS was positive for amphetamines, opiates, benzodiazepines, and methadone. The patient stated she was not up to talking to this provider. She disclosed that she is approaching the first anniversary of her husband's death. The patient is being followed by [facility], which she has her next appointment next week. The patient was seen  face-to-face by this provider; chart reviewed and consulted with Christina Cohen on 04/26/2019 due to the care of the patient. It was discussed with the EDP that the patient does not meet the criteria to be admitted to the inpatient unit. Due to her denying intentional overdose, suicidal ideations, and self-injurious behaviors. On evaluation, the patient is alert and oriented x 3, calm, cooperative, and mood-congruent with affect.  The patient does not appear to be responding to internal or external stimuli. Neither is the patient presenting with any delusional thinking. The patient denies auditory or visual hallucinations. The patient denies any suicidal, homicidal, or self-harm ideations. The patient is not presenting with any psychotic or paranoid behaviors. During an encounter with the patient, she was able to answer questions appropriately.  Plan: The patient is not a safety risk to self or others and does not require psychiatric inpatient admission for stabilization and treatment. HPI: Per Christina Cohen; Christina Tregre Beachis a 33 y.o.femalewith a history of drug abuse, who presents to the ED via police under IVC,concern for potential overdose. Per IVC paperwork "respondent's husband recently passed away and respondent stated that losing her husband makes her feel like she wants to overdose". On exam, patient is somnolent, but does arouse to painful stimuli. She does not participate in history. She denies any ingestion.  Total Time spent with patient: 30 minutes  Past Psychiatric History: substance abuse  Past Medical History: History reviewed. No pertinent past medical history.  Past Surgical History:  Procedure Laterality Date  . CESAREAN SECTION     Family History:  Family History  Problem Relation Age  of Onset  . Diabetes Maternal Grandmother   . Thyroid disease Maternal Grandmother    Family Psychiatric  History:none Social History:  Social History   Substance and Sexual Activity   Alcohol Use No  . Alcohol/week: 0.0 standard drinks     Social History   Substance and Sexual Activity  Drug Use Yes    Social History   Socioeconomic History  . Marital status: Legally Separated    Spouse name: Not on file  . Number of children: Not on file  . Years of education: Not on file  . Highest education level: Not on file  Occupational History  . Not on file  Social Needs  . Financial resource strain: Not on file  . Food insecurity    Worry: Not on file    Inability: Not on file  . Transportation needs    Medical: Not on file    Non-medical: Not on file  Tobacco Use  . Smoking status: Current Every Day Smoker    Packs/day: 1.00    Years: 10.00    Pack years: 10.00    Types: Cigarettes  . Smokeless tobacco: Never Used  Substance and Sexual Activity  . Alcohol use: No    Alcohol/week: 0.0 standard drinks  . Drug use: Yes  . Sexual activity: Not on file  Lifestyle  . Physical activity    Days per week: Not on file    Minutes per session: Not on file  . Stress: Not on file  Relationships  . Social Musician on phone: Not on file    Gets together: Not on file    Attends religious service: Not on file    Active member of club or organization: Not on file    Attends meetings of clubs or organizations: Not on file    Relationship status: Not on file  Other Topics Concern  . Not on file  Social History Narrative  . Not on file    Has this patient used any form of tobacco in the last 30 days? (Cigarettes, Smokeless Tobacco, Cigars, and/or Pipes) A prescription for an FDA-approved tobacco cessation medication was offered at discharge and the patient refused  Current Medications: No current facility-administered medications for this encounter.    No current outpatient medications on file.   PTA Medications: (Not in a hospital admission)   Musculoskeletal: Strength & Muscle Tone: within normal limits Gait & Station: normal Patient  leans: N/A  Psychiatric Specialty Exam: Physical Exam  Nursing note and vitals reviewed. Constitutional: She is oriented to person, place, and time. She appears well-developed and well-nourished.  HENT:  Head: Normocephalic.  Neck: Normal range of motion.  Respiratory: Effort normal.  Musculoskeletal: Normal range of motion.  Neurological: She is alert and oriented to person, place, and time.  Psychiatric: Her speech is normal and behavior is normal. Judgment and thought content normal. Her mood appears anxious. Cognition and memory are normal.    Review of Systems  Psychiatric/Behavioral: Positive for substance abuse. The patient is nervous/anxious.   All other systems reviewed and are negative.   Blood pressure 125/78, pulse 95, temperature 99.6 F (37.6 C), temperature source Oral, resp. rate 20, height 5\' 4"  (1.626 m), weight 54.4 kg, SpO2 97 %.Body mass index is 20.6 kg/m.  General Appearance: Disheveled  Eye Contact:  Good  Speech:  Normal Rate  Volume:  Normal  Mood:  Anxious  Affect:  Congruent  Thought Process:  Coherent and Descriptions of  Associations: Intact  Orientation:  Full (Time, Place, and Person)  Thought Content:  WDL and Logical  Suicidal Thoughts:  No  Homicidal Thoughts:  No  Memory:  Immediate;   Good Recent;   Good Remote;   Good  Judgement:  Fair  Insight:  Fair  Psychomotor Activity:  Normal  Concentration:  Concentration: Good and Attention Span: Good  Recall:  Good  Fund of Knowledge:  Fair  Language:  Good  Akathisia:  No  Handed:  Right  AIMS (if indicated):     Assets:  Housing Leisure Time Physical Health Resilience Social Support  ADL's:  Intact  Cognition:  WNL  Sleep:        Demographic Factors:  Caucasian  Loss Factors: NA  Historical Factors: NA  Risk Reduction Factors:   Sense of responsibility to family, Living with another person, especially a relative and Positive social support  Continued Clinical Symptoms:   Anxiety, mild  Cognitive Features That Contribute To Risk:  None    Suicide Risk:  Minimal: No identifiable suicidal ideation.  Patients presenting with no risk factors but with morbid ruminations; may be classified as minimal risk based on the severity of the depressive symptoms   Plan Of Care/Follow-up recommendations: Polysubstance dependence including opioid type drug, episodic abuse, with perceptual disturbance (HCC) -Fluids to flush out the drugs -Recommend rehab, patient declined, minimized use -Follow up with [facility]  Activity:  as tolerated Diet:  heart healthy diet  Disposition: discharge home Nanine MeansJamison Lord, NP 04/26/2019, 2:56 PM

## 2019-04-26 NOTE — ED Notes (Signed)
Pt requesting to use cell phone for phone call. Cell phone brought from belongings bag but battery is dead. Pt requesting a Games developer.  RN unable to find one at this time.

## 2019-04-26 NOTE — Consult Note (Signed)
[facility] Face-to-Face Psychiatry Consult   Reason for Consult: Drug overdose Referring Physician: Dr. Colon Branch Patient Identification: Christina Cohen MRN:  [id number] Principal Diagnosis: Polysubstance dependence including opioid type drug, episodic abuse, with perceptual disturbance (HCC) Diagnosis:  Principal Problem:   Polysubstance dependence including opioid type drug, episodic abuse, with perceptual disturbance (HCC) Active Problems:   Altered mental status   Endotracheally intubated   Thrombocytopenia (HCC)   Somnolence  Total Time spent with patient: 45 minutes  Subjective: "I am not in the mood to talk right now." Christina Cohen is a 33 y.o. female patient presented to [facility] ED,  under involuntary commitment status (IVC). After it was suspected that she overdosed on substances, it was reported that the patient-nurse found drugs in a syringe in her belongings. Her UDS was positive for amphetamines, opiates, benzodiazepines, and methadone. The patient stated she was not up to talking to this provider. She disclosed that she is approaching the first anniversary of her husband's death. The patient is being followed by [facility], which she has her next appointment next week. The patient was seen face-to-face by this provider; chart reviewed and consulted with Dr. York Cerise on 04/26/2019 due to the care of the patient. It was discussed with the EDP that the patient does not meet the criteria to be admitted to the inpatient unit. Due to her denying intentional overdose, suicidal ideations, and self-injurious behaviors. On evaluation, the patient is alert and oriented x 3, calm, cooperative, and mood-congruent with affect.  The patient does not appear to be responding to internal or external stimuli. Neither is the patient presenting with any delusional thinking. The patient denies auditory or visual hallucinations. The patient denies any suicidal, homicidal, or self-harm ideations. The patient is not presenting  with any psychotic or paranoid behaviors. During an encounter with the patient, she was able to answer questions appropriately.  Plan: The patient is not a safety risk to self or others and does not require psychiatric inpatient admission for stabilization and treatment. HPI: Per Dr. Colon Branch; Christina Cohen is a 33 y.o. female with a history of drug abuse, who presents to the ED via police under IVC, concern for potential overdose.  Per IVC paperwork "respondent's husband recently passed away and respondent stated that losing her husband makes her feel like she wants to overdose". On exam, patient is somnolent, but does arouse to painful stimuli.  She does not participate in history.  She denies any ingestion.  Past Psychiatric History:   Risk to Self: Suicidal Ideation: No Suicidal Intent: No Is patient at risk for suicide?: No Suicidal Plan?: No Access to Means: No What has been your use of drugs/alcohol within the last 12 months?: past use of heroin How many times?: 0 Other Self Harm Risks: pt denies Triggers for Past Attempts: None known Intentional Self Injurious Behavior: None Risk to Others: Homicidal Ideation: No Thoughts of Harm to Others: No Current Homicidal Intent: No Current Homicidal Plan: No Access to Homicidal Means: No Identified Victim: pt denies History of harm to others?: No Assessment of Violence: None Noted Violent Behavior Description: pt denies Does patient have access to weapons?: No Criminal Charges Pending?: Yes Describe Pending Criminal Charges: possession of drug paraphernalia Does patient have a court date: Yes Court Date: 06/27/19 Prior Inpatient Therapy: Prior Inpatient Therapy: No Prior Outpatient Therapy: Prior Outpatient Therapy: Yes Prior Therapy Dates: current Prior Therapy Facilty/Provider(s): [facility] Reason for Treatment: depression and SA use Does patient have an ACCT team?: No Does  patient have Intensive In-House Services?  : No Does patient  have [facility] services? : No Does patient have P4CC services?: No  Past Medical History: History reviewed. No pertinent past medical history.  Past Surgical History:  Procedure Laterality Date  . CESAREAN SECTION     Family History:  Family History  Problem Relation Age of Onset  . Diabetes Maternal Grandmother   . Thyroid disease Maternal Grandmother    Family Psychiatric  History:  Social History:  Social History   Substance and Sexual Activity  Alcohol Use No  . Alcohol/week: 0.0 standard drinks     Social History   Substance and Sexual Activity  Drug Use Yes    Social History   Socioeconomic History  . Marital status: Legally Separated    Spouse name: Not on file  . Number of children: Not on file  . Years of education: Not on file  . Highest education level: Not on file  Occupational History  . Not on file  Social Needs  . Financial resource strain: Not on file  . Food insecurity    Worry: Not on file    Inability: Not on file  . Transportation needs    Medical: Not on file    Non-medical: Not on file  Tobacco Use  . Smoking status: Current Every Day Smoker    Packs/day: 1.00    Years: 10.00    Pack years: 10.00    Types: Cigarettes  . Smokeless tobacco: Never Used  Substance and Sexual Activity  . Alcohol use: No    Alcohol/week: 0.0 standard drinks  . Drug use: Yes  . Sexual activity: Not on file  Lifestyle  . Physical activity    Days per week: Not on file    Minutes per session: Not on file  . Stress: Not on file  Relationships  . Social Musician on phone: Not on file    Gets together: Not on file    Attends religious service: Not on file    Active member of club or organization: Not on file    Attends meetings of clubs or organizations: Not on file    Relationship status: Not on file  Other Topics Concern  . Not on file  Social History Narrative  . Not on file   Additional Social History:    Allergies:  No Known  Allergies  Labs:  Results for orders placed or performed during the hospital encounter of 04/25/19 (from the past 48 hour(s))  CBC     Status: Abnormal   Collection Time: 04/25/19  1:28 PM  Result Value Ref Range   WBC 9.5 4.0 - 10.5 K/uL   RBC 4.07 3.87 - 5.11 MIL/uL   Hemoglobin 11.8 (L) 12.0 - 15.0 g/dL   HCT 45.4 (L) 09.8 - 11.9 %   MCV 86.5 80.0 - 100.0 fL   MCH 29.0 26.0 - 34.0 pg   MCHC 33.5 30.0 - 36.0 g/dL   RDW 14.7 82.9 - 56.2 %   Platelets 198 150 - 400 K/uL   nRBC 0.0 0.0 - 0.2 %    Comment: Performed at [facility], [address], [place], [place] [address]  Comprehensive metabolic panel     Status: Abnormal   Collection Time: 04/25/19  1:28 PM  Result Value Ref Range   Sodium 130 (L) 135 - 145 mmol/L   Potassium 4.1 3.5 - 5.1 mmol/L   Chloride 95 (L) 98 - 111 mmol/L  CO2 23 22 - 32 mmol/L   Glucose, Bld 112 (H) 70 - 99 mg/dL   BUN 22 (H) 6 - 20 mg/dL   Creatinine, Ser 1.611.06 (H) 0.44 - 1.00 mg/dL   Calcium 9.3 8.9 - 09.610.3 mg/dL   Total Protein 8.0 6.5 - 8.1 g/dL   Albumin 3.2 (L) 3.5 - 5.0 g/dL   AST 42 (H) 15 - 41 U/L   ALT 19 0 - 44 U/L   Alkaline Phosphatase 112 38 - 126 U/L   Total Bilirubin 1.2 0.3 - 1.2 mg/dL   GFR calc non Af Amer >60 >60 mL/min   GFR calc Af Amer >60 >60 mL/min   Anion gap 12 5 - 15    Comment: Performed at [facility], [address] [place], [place] [address]  SARS Coronavirus 2 [facility](Hospital order, Performed in [facility] hospital lab) Nasopharyngeal Nasopharyngeal Swab     Status: None   Collection Time: 04/25/19  2:16 PM   Specimen: Nasopharyngeal Swab  Result Value Ref Range   SARS Coronavirus 2 NEGATIVE NEGATIVE    Comment: (NOTE) If result is NEGATIVE SARS-CoV-2 target nucleic acids are NOT DETECTED. The SARS-CoV-2 RNA is generally detectable in upper and lower  respiratory specimens during the acute phase of infection. The lowest  concentration of SARS-CoV-2 viral copies this assay can detect is 250   copies / mL. A negative result does not preclude SARS-CoV-2 infection  and should not be used as the sole basis for treatment or other  patient management decisions.  A negative result may occur with  improper specimen collection / handling, submission of specimen other  than nasopharyngeal swab, presence of viral mutation(s) within the  areas targeted by this assay, and inadequate number of viral copies  (<250 copies / mL). A negative result must be combined with clinical  observations, patient history, and epidemiological information. If result is POSITIVE SARS-CoV-2 target nucleic acids are DETECTED. The SARS-CoV-2 RNA is generally detectable in upper and lower  respiratory specimens dur ing the acute phase of infection.  Positive  results are indicative of active infection with SARS-CoV-2.  Clinical  correlation with patient history and other diagnostic information is  necessary to determine patient infection status.  Positive results do  not rule out bacterial infection or co-infection with other viruses. If result is PRESUMPTIVE POSTIVE SARS-CoV-2 nucleic acids MAY BE PRESENT.   A presumptive positive result was obtained on the submitted specimen  and confirmed on repeat testing.  While 2019 novel coronavirus  (SARS-CoV-2) nucleic acids may be present in the submitted sample  additional confirmatory testing may be necessary for epidemiological  and / or clinical management purposes  to differentiate between  SARS-CoV-2 and other Sarbecovirus currently known to infect humans.  If clinically indicated additional testing with an alternate test  methodology [phone]([id number]) is advised. The SARS-CoV-2 RNA is generally  detectable in upper and lower respiratory sp ecimens during the acute  phase of infection. The expected result is Negative. Fact Sheet for Patients:  BoilerBrush.com.cyhttps://www.fda.gov/media/136312/download Fact Sheet for Healthcare  Providers: https://pope.com/https://www.fda.gov/media/136313/download This test is not yet approved or cleared by the [place] FDA and has been authorized for detection and/or diagnosis of SARS-CoV-2 by FDA under an Emergency Use Authorization (EUA).  This EUA will remain in effect (meaning this test can be used) for the duration of the COVID-19 declaration under Section 564(b)(1) of the Act, 21 U.S.C. section 360bbb-3(b)(1), unless the authorization is terminated or revoked sooner. Performed at [facility], [address] [place], [place] [address]   Acetaminophen level     Status: Abnormal   Collection Time: 04/25/19  3:03 PM  Result Value Ref Range   Acetaminophen (Tylenol), Serum <10 (L) 10 - 30 ug/mL    Comment: (NOTE) Therapeutic concentrations vary significantly. A range of 10-30 ug/mL  may be an effective concentration for many patients. However, some  are best treated at concentrations outside of this range. Acetaminophen concentrations >150 ug/mL at 4 hours after ingestion  and >50 ug/mL at 12 hours after ingestion are often associated with  toxic reactions. Performed at [facility], [address] [place], [place] [address]   Ethanol     Status: None   Collection Time: 04/25/19  3:03 PM  Result Value Ref Range   Alcohol, Ethyl (B) <10 <10 mg/dL    Comment: (NOTE) Lowest detectable limit for serum alcohol is 10 mg/dL. For medical purposes only. Performed at [facility], [address] [place], [place] [address]   Salicylate level     Status: None   Collection Time: 04/25/19  3:03 PM  Result Value Ref Range   Salicylate Lvl <7.0 2.8 - 30.0 mg/dL    Comment: Performed at [facility], [address] [place], [place] [address]  Pregnancy, urine     Status: None   Collection Time: 04/25/19  6:28 PM  Result Value Ref Range   Preg Test, Ur NEGATIVE NEGATIVE    Comment: Performed at [facility], [address]  [place], [place] [address]  Urine Drug Screen, Qualitative     Status: Abnormal   Collection Time: 04/25/19  6:28 PM  Result Value Ref Range   Tricyclic, Ur Screen NONE DETECTED NONE DETECTED   Amphetamines, Ur Screen POSITIVE (A) NONE DETECTED   MDMA (Ecstasy)Ur Screen NONE DETECTED NONE DETECTED   Cocaine Metabolite,Ur [place] NONE DETECTED NONE DETECTED   Opiate, Ur Screen POSITIVE (A) NONE DETECTED   Phencyclidine (PCP) Ur S NONE DETECTED NONE DETECTED   Cannabinoid 50 Ng, Ur [place] NONE DETECTED NONE DETECTED   Barbiturates, Ur Screen NONE DETECTED NONE DETECTED   Benzodiazepine, Ur Scrn POSITIVE (A) NONE DETECTED   Methadone Scn, Ur POSITIVE (A) NONE DETECTED    Comment: (NOTE) Tricyclics + metabolites, urine    Cutoff 1000 ng/mL Amphetamines + metabolites, urine  Cutoff 1000 ng/mL MDMA (Ecstasy), urine              Cutoff 500 ng/mL Cocaine Metabolite, urine          Cutoff 300 ng/mL Opiate + metabolites, urine        Cutoff 300 ng/mL Phencyclidine (PCP), urine         Cutoff 25 ng/mL Cannabinoid, urine                 Cutoff 50 ng/mL Barbiturates + metabolites, urine  Cutoff 200 ng/mL Benzodiazepine, urine              Cutoff 200 ng/mL Methadone, urine                   Cutoff 300 ng/mL The urine drug screen provides only a preliminary, unconfirmed analytical test result and should not be used for non-medical purposes. Clinical consideration and professional judgment should be applied to any positive drug screen result due to possible interfering substances. A more specific alternate chemical method must be used in order to obtain a confirmed analytical result. Gas chromatography /  mass spectrometry (GC/MS) is the preferred confirmat ory method. Performed at [facility], [address] [place], [place] [address]     No current facility-administered medications for this encounter.    No current outpatient medications on file.    Musculoskeletal: Strength & Muscle Tone:  within normal limits Gait & Station: normal Patient leans: N/A  Psychiatric Specialty Exam: Physical Exam  Nursing note and vitals reviewed. Constitutional: She is oriented to person, place, and time. She appears well-developed.  HENT:  Head: Normocephalic.  Eyes: Pupils are equal, round, and reactive to light.  Neck: Normal range of motion. Neck supple.  Cardiovascular: Normal rate.  Respiratory: Effort normal.  Musculoskeletal: Normal range of motion.  Neurological: She is alert and oriented to person, place, and time.  Skin: Skin is warm and dry.  Psychiatric: Thought content normal.    Review of Systems  Psychiatric/Behavioral: Positive for depression and substance abuse. The patient is nervous/anxious.   All other systems reviewed and are negative.   Blood pressure 125/78, pulse 97, temperature 99.6 F (37.6 C), temperature source Oral, resp. rate 20, height 5\' 4"  (1.626 m), weight 54.4 kg, SpO2 96 %.Body mass index is 20.6 kg/m.  General Appearance: Disheveled  Eye Contact:  Minimal  Speech:  Clear and Coherent and Slow  Volume:  Decreased  Mood:  Depressed  Affect:  Congruent  Thought Process:  Coherent  Orientation:  Full (Time, Place, and Person)  Thought Content:  WDL and Logical  Suicidal Thoughts:  No  Homicidal Thoughts:  No  Memory:  Immediate;   Fair Recent;   Fair Remote;   Fair  Judgement:  Fair  Insight:  Fair  Psychomotor Activity:  Decreased  Concentration:  Concentration: Poor and Attention Span: Poor  Recall:  Poor  Fund of Knowledge:  Poor  Language:  Fair  Akathisia:  Negative  Handed:  Right  AIMS (if indicated):     Assets:  Desire for Improvement Social Support  ADL's:  Intact  Cognition:  WNL  Sleep:   Okay     Treatment Plan Summary: Daily contact with patient to assess and evaluate symptoms and progress in treatment and Medication management  Disposition: No evidence of imminent risk to self or others at present.   Patient  does not meet criteria for psychiatric inpatient admission. Supportive therapy provided about ongoing stressors.  [name], NP 04/26/2019 5:38 AM

## 2019-04-26 NOTE — ED Notes (Signed)
TTS talking to pts father at this time.

## 2019-04-26 NOTE — Discharge Instructions (Signed)
[facility]) [facility]  Mental health service in [place], [place] Address: [address] [place], [place] [address]  Phone: [phone]  Substance Use Disorder and Mental Illness Substance use disorder is a condition in which a person is dependent on a substance, such as drugs or alcohol. A mental illness is a condition that occurs when someone experiences changes in mood, behavior, or thinking. Sometimes, these two conditions can occur at the same time (co-occurring disorders) and may be diagnosed together (dual diagnosis). What is the relationship between substance use disorder and mental illness? Substance use disorder and mental illness can share symptoms and can have similar causes, such as exposure to stress or changes in brain chemicals. The risk for developing both of these conditions can be passed from parent to child (inherited). People with mental illnesses sometimes use drugs to try and relieve symptoms, and this can lead to substance use disorder. Substance use disorders occur more often in people who have depression, schizophrenia, anxiety, or personality disorders. When some drugs are used regularly, they can cause people to have symptoms of mental illness. What are the signs or symptoms? Symptoms vary widely for substance use disorder and mental illness, especially because these conditions can be present at the same time. Signs of a substance use disorder may include:  Failure to meet responsibilities at home, work, or school.  Using substances in risky situation, such as while driving or using machinery.  Taking serious risks to get drugs or alcohol.  Spending less time on activities or hobbies that used to be important.  Changes in personality or attitude for no reason, such as angry outbursts, symptoms of anxiety, or unusual  giddiness.  Trying to hide the amount of drugs or alcohol used.  Increased substance use over time, or needing to use more of a substance to feel the same effects (developing a tolerance).  Sudden weight loss or gain.  Sleeping too much or too little.  Uncontrolled trembling or shaking (tremors), slurred speech, or lack of coordination.  Continuing to use alcohol or a drug even though using it has led to bad outcomes or consequences, such as losing a job or ending a relationship. Signs of mental illness may include:  Extreme mood changes (mood swings).  Sleeping too much or too little.  Weight loss or weight gain.  Being easily distracted.  Confused thinking or trouble concentrating.  Expressing thoughts of suicide.  Withdrawing from friends and family, or sudden changes in social behaviors or hobbies.  Aggression toward people and animals.  Repeatedly breaking serious rules or breaking the law.  Having persistent thoughts or urges that are unpleasant or feel out of control (involuntary). The person may feel the need to act on the urges in order to reduce anxiety.  Persistent feelings of sadness or hopelessness.  False beliefs (delusions).  Seeing, hearing, tasting, smelling, or feeling things that are not real (hallucinations). How is this diagnosed? Substance use disorder and mental illnesses can be difficult to diagnose at the same time because of how varied and complex the symptoms are. Because these conditions can interact, one condition may be missed. This is why it is important to be completely honest with your health care provider about substance use and your other symptoms. Diagnosing your condition may include:  A physical exam.  A review of your medical history and your symptoms. Your health care provider may  refer you to a mental health professional for a psychiatric evaluation. This may include assessments of:  Your use of substances.  Your risk of  suicide.  Your risk of aggressive behaviors.  Your lifestyle, environment, and social situations.  Your medical health.  Your mental health and behavioral history. How is this treated? It is best to treat substance use disorder and mental illness at the same time (integrated treatment approach). Treatment usually involves more than one of the following methods:  Detox. This refers to stopping substance abuse while being monitored by trained medical staff. This is usually the first step in treatment. Detox can last for up to 7 days.  Rehabilitation. This involves staying in a treatment center where you can have medical and mental health support all the time.  Medicines to relieve symptoms of mental illness and to control symptoms that are caused by stopping substance abuse (withdrawal symptoms).  Support groups. These groups encourage you to talk about your fears, frustrations, and anxieties with others who have the same condition.  Talk therapy. This is one-on-one therapy that can help you learn about your illness and learn ways to cope with symptoms or side effects. Follow these instructions at home: Lifestyle  Exercise regularly. Aim for 150 minutes of moderate exercise (such as walking or biking) or 75 minutes of vigorous exercise (such as running) each week.  Eat a healthy diet with plenty of fruits and vegetables, whole grains, and lean proteins.  Avoid caffeine and tobacco. These can worsen symptoms and anxiety.  Do not drink alcohol or use drugs.  Try to get 7-9 hours of sleep each night. To do this: ? Keep your bedroom cool and dark. ? Do not eat a heavy meal during the hour before you go to bed. ? Do not have caffeine before bedtime. ? Avoid screen time during the few hours before bedtime. This means not watching TV and not using a computer, cell phone, or tablet. Medicines  Take over-the-counter and prescription medicines only as told by your health care  provider.  Do not stop taking medicines unless you ask your health care provider if it is safe to do that.  Tell your health care provider about any medicine side effects that you experience. General instructions  Follow your treatment plan as directed. Work with your health care provider to adjust your treatment plan as needed.  Attend support group or therapy sessions as directed.  Explain your diagnosis to your friends and family. Let them know what your symptoms are and what things cause your symptoms to start (triggers).  Avoid triggers or stressors that may worsen your symptoms or cause you to use alcohol or drugs. Spend time with family and friends who do not use substances.  Make time to relax and do self-soothing activities, such as meditating or listening to music.  Keep all follow-up visits as told by your health care provider and therapist. This is important. Where to find support You may find support for coping with substance use disorder and mental illness from:  Your health care providers or your therapist. These providers can treat you or they can help you find services to treat your condition.  Local support groups for people with your condition. Your health care provider or therapist may be able to recommend a support group. This may be a hospital support group, a [employer] Mental Illness (NAMI) support group, or a 12-step group such as Alcoholics Anonymous (AA) or Narcotics Anonymous (NA).  Family  and friends. Let them know what they can do to best support you through your recovery process. Where to find more information You may find more information about substance use disorder and mental illness from:  Substance Abuse and Mental Health Services Administration [facility](SAMHSA): ? Online: SkateOasis.com.ptwww.samhsa.gov ? [employer], to talk with a person who can help you find information about treatment services in your area: (916) 796-34411-(305)299-0937 724-551-4179(1-877-SAMHSA7)  [place]  Department of Health and Health and safety inspectorHuman Services mental health services: https://www.vaughan-marshall.com/www.mentalhealth.gov  National Alliance on Mental Illness (NAMI): www.nami.org  [employer] on Alcoholism and Drug Dependence: www.ncadd.org Contact a health care provider if:  Your symptoms get worse or they do not get better.  You have negative side effects from taking medicines.  You want to discuss stopping medicines or treatment.  You start using a substance again (have a relapse). Get help right away if:  You have thoughts about harming yourself or others. If you ever feel like you may hurt yourself or others, or have thoughts about taking your own life, get help right away. You can go to your nearest emergency department or call:  Your local emergency services (911 in the [place]  A suicide crisis helpline, such as the National Suicide Prevention Lifeline at (989) 740-59611-(940)285-7617. This is open 24 hours a day. Summary  Substance use disorder and mental illness can occur at the same time (co-occurring disorders), and they may be diagnosed together (dual diagnosis).  These conditions can be difficult to diagnose at the same time. It is important to be completely honest with your health care provider about your substance use and your other symptoms.  It is best to treat substance use disorder and mental illness at the same time (integrated treatment approach).  Identifying new ways to deal with triggers and difficult situations is an important part of recovery.  Keep all follow-up visits as told by your health care provider and therapist. Be consistent when attending support groups or treatment programs. This information is not intended to replace advice given to you by your health care provider. Make sure you discuss any questions you have with your health care provider. Document Released: 12/30/2016 Document Revised: 10/11/2018 Document Reviewed: 12/30/2016 Elsevier Patient Education  2020 [employer]

## 2019-04-26 NOTE — ED Notes (Signed)
Pt awake in bed, given coke. Asked about when psychiatrist would be by. Informed sometime this AM more than likely, made aware that is all we were waiting on. Pt compliant and calm in bed.

## 2019-04-28 ENCOUNTER — Other Ambulatory Visit: Payer: Self-pay

## 2019-04-28 ENCOUNTER — Emergency Department
Admission: EM | Admit: 2019-04-28 | Discharge: 2019-04-29 | Disposition: A | Discharge status: Other Acute Inpatient Hospital | Payer: Self-pay | Attending: Emergency Medicine | Admitting: Emergency Medicine

## 2019-04-28 DIAGNOSIS — T50902A Poisoning by unspecified drugs, medicaments and biological substances, intentional self-harm, initial encounter: Secondary | ICD-10-CM | POA: Diagnosis present

## 2019-04-28 DIAGNOSIS — F329 Major depressive disorder, single episode, unspecified: Secondary | ICD-10-CM | POA: Insufficient documentation

## 2019-04-28 DIAGNOSIS — Z20828 Contact with and (suspected) exposure to other viral communicable diseases: Secondary | ICD-10-CM | POA: Insufficient documentation

## 2019-04-28 DIAGNOSIS — F332 Major depressive disorder, recurrent severe without psychotic features: Secondary | ICD-10-CM | POA: Diagnosis present

## 2019-04-28 DIAGNOSIS — T50904A Poisoning by unspecified drugs, medicaments and biological substances, undetermined, initial encounter: Secondary | ICD-10-CM

## 2019-04-28 DIAGNOSIS — F17210 Nicotine dependence, cigarettes, uncomplicated: Secondary | ICD-10-CM | POA: Insufficient documentation

## 2019-04-28 DIAGNOSIS — F19288 Other psychoactive substance dependence with other psychoactive substance-induced disorder: Secondary | ICD-10-CM | POA: Diagnosis present

## 2019-04-28 LAB — SALICYLATE LEVEL: Salicylate Lvl: <7 mg/dL (ref 2.8–30.0)

## 2019-04-28 LAB — CBC
HCT: 38.1 % (ref 36.0–46.0)
Hemoglobin: 12.5 g/dL (ref 12.0–15.0)
MCH: 29.5 pg (ref 26.0–34.0)
MCHC: 32.8 g/dL (ref 30.0–36.0)
MCV: 89.9 fL (ref 80.0–100.0)
Platelets: 323 10*3/uL (ref 150–400)
RBC: 4.24 MIL/uL (ref 3.87–5.11)
RDW: 13.8 % (ref 11.5–15.5)
WBC: 5 10*3/uL (ref 4.0–10.5)
nRBC: 0 % (ref 0.0–0.2)

## 2019-04-28 LAB — COMPREHENSIVE METABOLIC PANEL
ALT: 29 U/L (ref 0–44)
AST: 48 U/L — ABNORMAL HIGH (ref 15–41)
Albumin: 3.1 g/dL — ABNORMAL LOW (ref 3.5–5.0)
Alkaline Phosphatase: 120 U/L (ref 38–126)
Anion gap: 10 (ref 5–15)
BUN: 15 mg/dL (ref 6–20)
CO2: 27 mmol/L (ref 22–32)
Calcium: 8.9 mg/dL (ref 8.9–10.3)
Chloride: 99 mmol/L (ref 98–111)
Creatinine, Ser: 0.83 mg/dL (ref 0.44–1.00)
GFR calc Af Amer: >60 mL/min (ref >60)
GFR calc non Af Amer: >60 mL/min (ref >60)
Glucose, Bld: 99 mg/dL (ref 70–99)
Potassium: 3.5 mmol/L (ref 3.5–5.1)
Sodium: 136 mmol/L (ref 135–145)
Total Bilirubin: 0.5 mg/dL (ref 0.3–1.2)
Total Protein: 7.5 g/dL (ref 6.5–8.1)

## 2019-04-28 LAB — URINE DRUG SCREEN, QUALITATIVE (ARMC ONLY)
Amphetamines, Ur Screen: POSITIVE — AB
Barbiturates, Ur Screen: NOT DETECTED
Benzodiazepine, Ur Scrn: POSITIVE — AB
Cannabinoid 50 Ng, Ur ~~LOC~~: NOT DETECTED
Cocaine Metabolite,Ur ~~LOC~~: NOT DETECTED
MDMA (Ecstasy)Ur Screen: NOT DETECTED
Methadone Scn, Ur: POSITIVE — AB
Opiate, Ur Screen: POSITIVE — AB
Phencyclidine (PCP) Ur S: NOT DETECTED
Tricyclic, Ur Screen: NOT DETECTED

## 2019-04-28 LAB — ACETAMINOPHEN LEVEL: Acetaminophen (Tylenol), Serum: <10 ug/mL — ABNORMAL LOW (ref 10–30)

## 2019-04-28 LAB — SARS CORONAVIRUS 2 BY RT PCR (HOSPITAL ORDER, PERFORMED IN ~~LOC~~ HOSPITAL LAB): SARS Coronavirus 2: NEGATIVE

## 2019-04-28 LAB — ETHANOL: Alcohol, Ethyl (B): <10 mg/dL (ref <10)

## 2019-04-28 LAB — HCG, QUANTITATIVE, PREGNANCY: hCG, Beta Chain, Quant, S: <1 m[IU]/mL (ref <5)

## 2019-04-28 MED ORDER — LOPERAMIDE HCL 2 MG PO CAPS
2.0000 mg | ORAL_CAPSULE | ORAL | Status: DC | PRN
  Filled 2019-04-28: qty 2

## 2019-04-28 MED ORDER — ONDANSETRON 4 MG PO TBDP
4.0000 mg | ORAL_TABLET | Freq: Four times a day (QID) | ORAL | Status: DC | PRN
  Filled 2019-04-28: qty 1

## 2019-04-28 MED ORDER — DICYCLOMINE HCL 20 MG PO TABS
20.0000 mg | ORAL_TABLET | Freq: Four times a day (QID) | ORAL | Status: DC | PRN
  Filled 2019-04-28: qty 1

## 2019-04-28 MED ORDER — CLONIDINE HCL 0.1 MG PO TABS
0.1000 mg | ORAL_TABLET | Freq: Four times a day (QID) | ORAL | Status: DC
  Administered 2019-04-28 – 2019-04-29 (×4): 0.1 mg via ORAL
  Filled 2019-04-28 (×4): qty 1

## 2019-04-28 MED ORDER — GABAPENTIN 300 MG PO CAPS
300.0000 mg | ORAL_CAPSULE | Freq: Three times a day (TID) | ORAL | Status: DC
  Administered 2019-04-28 – 2019-04-29 (×3): 300 mg via ORAL
  Filled 2019-04-28 (×3): qty 1

## 2019-04-28 MED ORDER — NAPROXEN 500 MG PO TABS
500.0000 mg | ORAL_TABLET | Freq: Two times a day (BID) | ORAL | Status: DC | PRN
  Filled 2019-04-28: qty 1

## 2019-04-28 MED ORDER — CLONIDINE HCL 0.1 MG PO TABS: 0.1000 mg | ORAL_TABLET | Freq: Every day | ORAL | Status: DC

## 2019-04-28 MED ORDER — METHOCARBAMOL 500 MG PO TABS
500.0000 mg | ORAL_TABLET | Freq: Three times a day (TID) | ORAL | Status: DC | PRN
  Filled 2019-04-28: qty 1

## 2019-04-28 MED ORDER — CLONIDINE HCL 0.1 MG PO TABS: 0.1000 mg | ORAL_TABLET | ORAL | Status: DC

## 2019-04-28 MED ORDER — HYDROXYZINE HCL 25 MG PO TABS: 25.0000 mg | ORAL_TABLET | Freq: Four times a day (QID) | ORAL | Status: DC | PRN

## 2019-04-28 MED ORDER — SODIUM CHLORIDE 0.9 % IV BOLUS
1000.0000 mL | Freq: Once | INTRAVENOUS | Status: AC
  Administered 2019-04-28: 1000 mL via INTRAVENOUS

## 2019-04-28 NOTE — ED Notes (Signed)
Pt does not want family notified at this time

## 2019-04-28 NOTE — ED Notes (Signed)
Pt given dinner tray.

## 2019-04-28 NOTE — ED Triage Notes (Signed)
Pt overdosed on opiates and klonopin. Pt very sleepy, falling asleep during triage.

## 2019-04-28 NOTE — Consult Note (Signed)
[facility] Face-to-Face Psychiatry Consult   Reason for Consult:  Overdose  Referring Physician:  EDP Patient Identification: Christina Cohen MRN:  [id number] Principal Diagnosis: Major depressive disorder, recurrent severe without psychotic features (HCC) Diagnosis:  Principal Problem:   Major depressive disorder, recurrent severe without psychotic features (HCC) Active Problems:   Polysubstance dependence including opioid type drug, episodic abuse, with perceptual disturbance (HCC)   Overdose, intentional self-harm, initial encounter (HCC)   Total Time spent with patient: 1 hour  Subjective:   Christina Cohen is a 33 y.o. female patient admitted with overdose, second in 3 days.  Continues to say she was not trying to hurt herself but too many attempts close to important anniversaries.  Patient seen and evaluated in person by this provider.  She initially had her head under the sheet despite having a dark room.  She minimizes her overdose despite having one three days ago and in the past.  Friday she was released based on the collateral information from her father.  Due to her repetitive behavior, she is high risk at this point.  Friday she was focused on her daughter's birthday who her parents have custody but it was also the anniversary of her dead husband.  This appears to be a big factor in her increase in substances and risky behaviors with two overdoses.  Patient to be admitted.  HPI per TTS:  33 y.o. female who presents to the ER due to an overdosed opiates and klonopin. Patient was recently in the ER for similar presentation and was discharge with safety plan. However, patient was found unresponsive in her bathroom with needles drug paraphernalia.   When patient was last in the ER she shared it was the anniversary of the death of her husband. Patient is high risk of hurting herself. Due the her impulsive, high risk behaviors; the lack of supervision her natural support is able to provide and  the anniversary of her husband's death.  Per note on 05-07-23:  Patient seen and evaluated in the ED face-face by this provider.  Polysubstance use with accidental drug overdoses in the past.  No psychiatric admissions.  Encouraged drug rehab but patient does not acknowledge a problem.  She does go to [facility] for her care but feel a long-term rehab would be beneficial.  Patient does not concur.  No threat at this time and family, father (below), does not feel she is a risk.   Past Psychiatric History: substance abuse, depression, and anxiety  Risk to Self: Suicidal Ideation: No-Not Currently/Within Last 6 Months Suicidal Intent: No-Not Currently/Within Last 6 Months Is patient at risk for suicide?: Yes Suicidal Plan?: No-Not Currently/Within Last 6 Months Access to Means: No What has been your use of drugs/alcohol within the last 12 months?: Amphetamines, Opiates, Benzodiazepine & Methadone How many times?: 0 Other Self Harm Risks: Active Drug use Triggers for Past Attempts: None known Intentional Self Injurious Behavior: None Risk to Others: Homicidal Ideation: No Thoughts of Harm to Others: No Current Homicidal Intent: No Current Homicidal Plan: No Access to Homicidal Means: No Identified Victim: Reports of none History of harm to others?: No Assessment of Violence: None Noted Violent Behavior Description: Reports of none Does patient have access to weapons?: No Criminal Charges Pending?: No Describe Pending Criminal Charges: Reports of none Does patient have a court date: Yes Court Date: 06/27/19 Prior Inpatient Therapy: Prior Inpatient Therapy: Yes Prior Therapy Dates: 07/2012 Prior Therapy Facilty/Provider(s): [facility] Reason for Treatment: Substance Abuse Prior  Outpatient Therapy: Prior Outpatient Therapy: Yes Prior Therapy Dates: current Prior Therapy Facilty/Provider(s): [facility] Reason for Treatment: depression and SA use Does patient have an ACCT team?: No Does patient  have Intensive In-House Services?  : No Does patient have [facility] services? : No Does patient have P4CC services?: No  Past Medical History: No past medical history on file.  Past Surgical History:  Procedure Laterality Date  . CESAREAN SECTION     Family History:  Family History  Problem Relation Age of Onset  . Diabetes Maternal Grandmother   . Thyroid disease Maternal Grandmother    Family Psychiatric  History: none Social History:  Social History   Substance and Sexual Activity  Alcohol Use No  . Alcohol/week: 0.0 standard drinks     Social History   Substance and Sexual Activity  Drug Use Yes    Social History   Socioeconomic History  . Marital status: Legally Separated    Spouse name: Not on file  . Number of children: Not on file  . Years of education: Not on file  . Highest education level: Not on file  Occupational History  . Not on file  Social Needs  . Financial resource strain: Not on file  . Food insecurity    Worry: Not on file    Inability: Not on file  . Transportation needs    Medical: Not on file    Non-medical: Not on file  Tobacco Use  . Smoking status: Current Every Day Smoker    Packs/day: 1.00    Years: 10.00    Pack years: 10.00    Types: Cigarettes  . Smokeless tobacco: Never Used  Substance and Sexual Activity  . Alcohol use: No    Alcohol/week: 0.0 standard drinks  . Drug use: Yes  . Sexual activity: Not on file  Lifestyle  . Physical activity    Days per week: Not on file    Minutes per session: Not on file  . Stress: Not on file  Relationships  . Social Musician on phone: Not on file    Gets together: Not on file    Attends religious service: Not on file    Active member of club or organization: Not on file    Attends meetings of clubs or organizations: Not on file    Relationship status: Not on file  Other Topics Concern  . Not on file  Social History Narrative  . Not on file   Additional Social  History:    Allergies:  No Known Allergies  Labs:  Results for orders placed or performed during the hospital encounter of 04/28/19 (from the past 48 hour(s))  CBC     Status: None   Collection Time: 04/28/19 12:58 AM  Result Value Ref Range   WBC 5.0 4.0 - 10.5 K/uL   RBC 4.24 3.87 - 5.11 MIL/uL   Hemoglobin 12.5 12.0 - 15.0 g/dL   HCT 90.2 11.1 - 55.2 %   MCV 89.9 80.0 - 100.0 fL   MCH 29.5 26.0 - 34.0 pg   MCHC 32.8 30.0 - 36.0 g/dL   RDW 08.0 22.3 - 36.1 %   Platelets 323 150 - 400 K/uL   nRBC 0.0 0.0 - 0.2 %    Comment: Performed at [facility], [address], [place], [place] [address]  Acetaminophen level     Status: Abnormal   Collection Time: 04/28/19  1:35 AM  Result Value Ref Range  Acetaminophen (Tylenol), Serum <10 (L) 10 - 30 ug/mL    Comment: (NOTE) Therapeutic concentrations vary significantly. A range of 10-30 ug/mL  may be an effective concentration for many patients. However, some  are best treated at concentrations outside of this range. Acetaminophen concentrations >150 ug/mL at 4 hours after ingestion  and >50 ug/mL at 12 hours after ingestion are often associated with  toxic reactions. Performed at [facility], [address] [place], [place] [address]   Ethanol     Status: None   Collection Time: 04/28/19  1:35 AM  Result Value Ref Range   Alcohol, Ethyl (B) <10 <10 mg/dL    Comment: (NOTE) Lowest detectable limit for serum alcohol is 10 mg/dL. For medical purposes only. Performed at [facility], [address] [place], [place] [address]   Comprehensive metabolic panel     Status: Abnormal   Collection Time: 04/28/19  1:35 AM  Result Value Ref Range   Sodium 136 135 - 145 mmol/L   Potassium 3.5 3.5 - 5.1 mmol/L   Chloride 99 98 - 111 mmol/L   CO2 27 22 - 32 mmol/L   Glucose, Bld 99 70 - 99 mg/dL   BUN 15 6 - 20 mg/dL   Creatinine, Ser 5.620.83 0.44 - 1.00 mg/dL   Calcium 8.9 8.9 - 13.010.3 mg/dL   Total  Protein 7.5 6.5 - 8.1 g/dL   Albumin 3.1 (L) 3.5 - 5.0 g/dL   AST 48 (H) 15 - 41 U/L   ALT 29 0 - 44 U/L   Alkaline Phosphatase 120 38 - 126 U/L   Total Bilirubin 0.5 0.3 - 1.2 mg/dL   GFR calc non Af Amer >60 >60 mL/min   GFR calc Af Amer >60 >60 mL/min   Anion gap 10 5 - 15    Comment: Performed at [facility], [address] [place], [place] [address]  hCG, quantitative, pregnancy     Status: None   Collection Time: 04/28/19  1:35 AM  Result Value Ref Range   hCG, Beta Chain, Quant, S <1 <5 mIU/mL    Comment:          GEST. AGE      CONC.  (mIU/mL)   <=1 WEEK        5 - 50     2 WEEKS       50 - 500     3 WEEKS       100 - 10,000     4 WEEKS     1,000 - 30,000     5 WEEKS     3,500 - 115,000   6-8 WEEKS     12,000 - 270,000    12 WEEKS     15,000 - 220,000        FEMALE AND NON-PREGNANT FEMALE:     LESS THAN 5 mIU/mL Performed at [facility], [address] [place], [place] [address]   Salicylate level     Status: None   Collection Time: 04/28/19  1:35 AM  Result Value Ref Range   Salicylate Lvl <7.0 2.8 - 30.0 mg/dL    Comment: Performed at [facility], [address] [place], [place] [address]  Urine Drug Screen, Qualitative ([facility] only)     Status: Abnormal   Collection Time: 04/28/19  1:47 PM  Result Value Ref Range   Tricyclic, Ur Screen NONE DETECTED NONE DETECTED   Amphetamines, Ur Screen POSITIVE (A) NONE DETECTED  MDMA (Ecstasy)Ur Screen NONE DETECTED NONE DETECTED   Cocaine Metabolite,Ur [place] NONE DETECTED NONE DETECTED   Opiate, Ur Screen POSITIVE (A) NONE DETECTED   Phencyclidine (PCP) Ur S NONE DETECTED NONE DETECTED   Cannabinoid 50 Ng, Ur [place] NONE DETECTED NONE DETECTED   Barbiturates, Ur Screen NONE DETECTED NONE DETECTED   Benzodiazepine, Ur Scrn POSITIVE (A) NONE DETECTED   Methadone Scn, Ur POSITIVE (A) NONE DETECTED    Comment: (NOTE) Tricyclics + metabolites, urine    Cutoff 1000 ng/mL Amphetamines + metabolites,  urine  Cutoff 1000 ng/mL MDMA (Ecstasy), urine              Cutoff 500 ng/mL Cocaine Metabolite, urine          Cutoff 300 ng/mL Opiate + metabolites, urine        Cutoff 300 ng/mL Phencyclidine (PCP), urine         Cutoff 25 ng/mL Cannabinoid, urine                 Cutoff 50 ng/mL Barbiturates + metabolites, urine  Cutoff 200 ng/mL Benzodiazepine, urine              Cutoff 200 ng/mL Methadone, urine                   Cutoff 300 ng/mL The urine drug screen provides only a preliminary, unconfirmed analytical test result and should not be used for non-medical purposes. Clinical consideration and professional judgment should be applied to any positive drug screen result due to possible interfering substances. A more specific alternate chemical method must be used in order to obtain a confirmed analytical result. Gas chromatography / mass spectrometry (GC/MS) is the preferred confirmat ory method. Performed at [facility], [address] [place], [place] [address]   SARS Coronavirus 2 [facility] order, Performed in [facility] hospital lab) Nasopharyngeal Nasopharyngeal Swab     Status: None   Collection Time: 04/28/19  1:47 PM   Specimen: Nasopharyngeal Swab  Result Value Ref Range   SARS Coronavirus 2 NEGATIVE NEGATIVE    Comment: (NOTE) If result is NEGATIVE SARS-CoV-2 target nucleic acids are NOT DETECTED. The SARS-CoV-2 RNA is generally detectable in upper and lower  respiratory specimens during the acute phase of infection. The lowest  concentration of SARS-CoV-2 viral copies this assay can detect is 250  copies / mL. A negative result does not preclude SARS-CoV-2 infection  and should not be used as the sole basis for treatment or other  patient management decisions.  A negative result may occur with  improper specimen collection / handling, submission of specimen other  than nasopharyngeal swab, presence of viral mutation(s) within the  areas targeted by this  assay, and inadequate number of viral copies  (<250 copies / mL). A negative result must be combined with clinical  observations, patient history, and epidemiological information. If result is POSITIVE SARS-CoV-2 target nucleic acids are DETECTED. The SARS-CoV-2 RNA is generally detectable in upper and lower  respiratory specimens dur ing the acute phase of infection.  Positive  results are indicative of active infection with SARS-CoV-2.  Clinical  correlation with patient history and other diagnostic information is  necessary to determine patient infection status.  Positive results do  not rule out bacterial infection or co-infection with other viruses. If result is PRESUMPTIVE POSTIVE SARS-CoV-2 nucleic acids MAY BE PRESENT.   A presumptive positive result was obtained on the submitted specimen  and confirmed on repeat testing.  While 2019 novel coronavirus  (SARS-CoV-2) nucleic acids may be present in the submitted sample  additional confirmatory testing may be necessary for epidemiological  and / or clinical management purposes  to differentiate between  SARS-CoV-2 and other Sarbecovirus currently known to infect humans.  If clinically indicated additional testing with an alternate test  methodology [phone] is advised. The SARS-CoV-2 RNA is generally  detectable in upper and lower respiratory sp ecimens during the acute  phase of infection. The expected result is Negative. Fact Sheet for Patients:  BoilerBrush.com.cyhttps://www.fda.gov/media/136312/download Fact Sheet for Healthcare Providers: https://pope.com/https://www.fda.gov/media/136313/download This test is not yet approved or cleared by the [place] FDA and has been authorized for detection and/or diagnosis of SARS-CoV-2 by FDA under an Emergency Use Authorization (EUA).  This EUA will remain in effect (meaning this test can be used) for the duration of the COVID-19 declaration under Section 564(b)(1) of the Act, 21 U.S.C. section 360bbb-3(b)(1),  unless the authorization is terminated or revoked sooner. Performed at [facility], [address] [place], [place] [address]     No current facility-administered medications for this encounter.    No current outpatient medications on file.    Musculoskeletal: Strength & Muscle Tone: within normal limits Gait & Station: normal Patient leans: N/A  Psychiatric Specialty Exam: Physical Exam  Nursing note and vitals reviewed. Constitutional: She is oriented to person, place, and time. She appears well-developed and well-nourished.  HENT:  Head: Normocephalic.  Neck: Normal range of motion.  Respiratory: Effort normal.  Musculoskeletal: Normal range of motion.  Neurological: She is alert and oriented to person, place, and time.  Psychiatric: Her speech is normal and behavior is normal. Her mood appears anxious. Cognition and memory are normal. She expresses impulsivity. She exhibits a depressed mood. She expresses suicidal ideation. She expresses suicidal plans.    Review of Systems  Psychiatric/Behavioral: Positive for depression, substance abuse and suicidal ideas. The patient is nervous/anxious.   All other systems reviewed and are negative.   Blood pressure 110/75, pulse 82, temperature 98 F (36.7 C), resp. rate (!) 28, SpO2 100 %.There is no height or weight on file to calculate BMI.  General Appearance: Disheveled  Eye Contact:  Fair  Speech:  Normal Rate  Volume:  Decreased  Mood:  Anxious and Depressed  Affect:  Congruent  Thought Process:  Coherent and Descriptions of Associations: Intact  Orientation:  Full (Time, Place, and Person)  Thought Content:  Rumination  Suicidal Thoughts:  Yes.  with intent/plan  Homicidal Thoughts:  No  Memory:  Immediate;   Fair Recent;   Fair Remote;   Fair  Judgement:  Impaired  Insight:  Lacking  Psychomotor Activity:  Decreased  Concentration:  Concentration: Fair and Attention Span: Fair  Recall:  [employer]Fair  Fund of  Knowledge:  Fair  Language:  Good  Akathisia:  No  Handed:  Right  AIMS (if indicated):     Assets:  Leisure Time Physical Health Resilience Social Support  ADL's:  Intact  Cognition:  WNL  Sleep:        Treatment Plan Summary: Daily contact with patient to assess and evaluate symptoms and progress in treatment, Medication management and Plan major depressive disorder, recurrent, severe without psychosis: -Ad[facility]  Polysubstance dependence: -Start clonidine opiate withdrawal symptoms -start gabapentin 300 mg TID  Disposition: Recommend psychiatric Inpatient admission when medically cleared.  Nanine MeansJamison Billiejo Sorto, NP 04/28/2019 3:56 PM

## 2019-04-28 NOTE — ED Notes (Signed)
Pt had removed monitoring equipment and urinated in the bed- sheets, pt gown, and blanket changed- bed cleaned- pt removed 1 pair of pink underwear and 1 pair of jeans which were placed in the pt's belongings bag- pt given cup of soda

## 2019-04-28 NOTE — ED Notes (Signed)
Lab informed this RN blood needs redraw

## 2019-04-28 NOTE — ED Notes (Addendum)
Bag of belongings removed from room- pt sticker placed on bag- contained 1 white sports bra, 1 pair of brown sandals, 1 cellphone, 1 cellphone charger, 1 pair of sunglasses, and 1 grey shirt

## 2019-04-28 NOTE — ED Notes (Signed)
Ann, rn, pt's primary rn at bedside.

## 2019-04-28 NOTE — ED Notes (Signed)
Pt sleeping soundly with even unlabored respirations, wakes when woken to loud calling of name then returns to sleep, denies needs, will continue to monitor

## 2019-04-28 NOTE — ED Notes (Signed)
Pt arouses to voice but will not answer questions at this time. Nods no when asked if she is ready to talk to the psych MD.

## 2019-04-28 NOTE — ED Provider Notes (Addendum)
[facility] Emergency Department Provider Note  ____________________________________________  Time seen: Approximately 1:31 AM  I have reviewed the triage vital signs and the nursing notes.   HISTORY  Chief Complaint Drug Overdose  Level 5 caveat:  Portions of the history and physical were unable to be obtained due to intoxication/ sedation   HPI Christina Cohen is a 33 y.o. female with a history of polysubstance abuse who presents IVC by police for an overdose.  Police was called to the house where patient was found unresponsive in the bathroom.  They saw a needles and IV drug use paraphernalia next to the patient.  Patient has a history of benzo and opiate abuse. Patient is sedated/ somnolent but arousable to sternal rub.  Only endorses taking Xanax.  She does have a fresh needlestick to the left arm but at this point is denying IV drug use.  She is not answering questions regarding any suicidality. Maintaining airway, normal breathing and sats.    Patient Active Problem List   Diagnosis Date Noted  . Somnolence   . Thrombocytopenia (HCC) 12/24/2014  . Polysubstance dependence including opioid type drug, episodic abuse, with perceptual disturbance (HCC) 12/22/2014    Past Surgical History:  Procedure Laterality Date  . CESAREAN SECTION      Prior to Admission medications   Not on File    Allergies Patient has no known allergies.  Family History  Problem Relation Age of Onset  . Diabetes Maternal Grandmother   . Thyroid disease Maternal Grandmother     Social History Social History   Tobacco Use  . Smoking status: Current Every Day Smoker    Packs/day: 1.00    Years: 10.00    Pack years: 10.00    Types: Cigarettes  . Smokeless tobacco: Never Used  Substance Use Topics  . Alcohol use: No    Alcohol/week: 0.0 standard drinks  . Drug use: Yes    Review of Systems  Constitutional: Negative for fever. + intoxication   ____________________________________________   PHYSICAL EXAM:  VITAL SIGNS: ED Triage Vitals [04/28/19 0044]  Enc Vitals Group     BP 97/64     Pulse Rate (!) 109     Resp 10     Temp 98 F (36.7 C)     Temp src      SpO2 99 %     Weight      Height      Head Circumference      Peak Flow      Pain Score      Pain Loc      Pain Edu?      Excl. in GC?     Constitutional: Sedated, somnolent, arousable to sternal rub, breathing normally HEENT:      Head: Normocephalic and atraumatic.         Eyes: Conjunctivae are normal. Sclera is non-icteric. Pupils 11mm and reactive      Mouth/Throat: Mucous membranes are dry.       Neck: Supple with no signs of meningismus. Cardiovascular: Regular rate and rhythm. No murmurs, gallops, or rubs. 2+ symmetrical distal pulses are present in all extremities. No JVD. Respiratory: Normal respiratory effort. Lungs are clear to auscultation bilaterally. No wheezes, crackles, or rhonchi.  Gastrointestinal: Soft, non tender, and non distended with positive bowel sounds. No rebound or guarding. Musculoskeletal: Fresh track mark on the L wrist Neurologic: Slurring speech, GCS 11 Skin: Skin is warm, dry and intact. No rash  noted.  ____________________________________________   LABS (all labs ordered are listed, but only abnormal results are displayed)  Labs Reviewed  ACETAMINOPHEN LEVEL - Abnormal; Notable for the following components:      Result Value   Acetaminophen (Tylenol), Serum <10 (*)    All other components within normal limits  COMPREHENSIVE METABOLIC PANEL - Abnormal; Notable for the following components:   Albumin 3.1 (*)    AST 48 (*)    All other components within normal limits  CBC  ETHANOL  HCG, QUANTITATIVE, PREGNANCY  SALICYLATE LEVEL  URINE DRUG SCREEN, QUALITATIVE ([facility] ONLY)  CBG MONITORING, ED   ____________________________________________  EKG  ED ECG REPORT I, Nita Sicklearolina Kewana Sanon, the attending physician,  personally viewed and interpreted this ECG.  Normal sinus rhythm, rate of 86, normal intervals, borderline right axis deviation, T wave inversion in anterior leads, no ST elevations or depressions.  Unchanged from prior. ____________________________________________  RADIOLOGY  none  ____________________________________________   PROCEDURES  Procedure(s) performed: None Procedures Critical Care performed:  None ____________________________________________   INITIAL IMPRESSION / ASSESSMENT AND PLAN / ED COURSE  33 y.o. female with a history of polysubstance abuse who presents IVC by police for an overdose.  Patient arrives with a GCS of 11, fresh track mark on the left wrist.  Unknown substance.  IV drug use paraphernalia found next to the patient by police.  Patient was placed on a end-tidal CO2 and telemetry.  At this time she is maintaining her airway and breathing normally.  No indication for Narcan.  We will continue to monitor her respiratory status closely.  Patient will remain under IVC.  EKG showing no dysrhythmias.  Labs for medical clearance are pending.  Clinical Course as of Apr 27 650  [name] Apr 28, 2019  [id number] Patient remains stable. Maintaining airway. Breathing normal. Normal EtCO2 and sats.   [CV]  0543 Patient remains stable. Maintaining airway. Breathing normal, normal end tidal, and sats.   [CV]  0604 Patient more alert but still pretty somnolent. Waiting for patient to be more awake for psych eval. Labs for medical clearance are WNL.   [CV]  [phone] Patient remains stable, will be signed out to incoming MD at 7AM   [CV]    Clinical Course User Index [CV] Don PerkingVeronese, [place], MD      As part of my medical decision making, I reviewed the following data within the electronic MEDICAL RECORD NUMBER Nursing notes reviewed and incorporated, Labs reviewed , EKG interpreted , Old EKG reviewed, Old chart reviewed, Notes from prior ED visits and [place] Controlled Substance Database    Patient was evaluated in Emergency Department today for the symptoms described in the history of present illness. Patient was evaluated in the context of the global COVID-19 pandemic, which necessitated consideration that the patient might be at risk for infection with the SARS-CoV-2 virus that causes COVID-19. Institutional protocols and algorithms that pertain to the evaluation of patients at risk for COVID-19 are in a state of rapid change based on information released by regulatory bodies including the CDC and federal and state organizations. These policies and algorithms were followed during the patient's care in the ED.   ____________________________________________   FINAL CLINICAL IMPRESSION(S) / ED DIAGNOSES   Final diagnoses:  Drug overdose, undetermined intent, initial encounter      NEW MEDICATIONS STARTED DURING THIS VISIT:  ED Discharge Orders    None       Note:  This document was prepared using Dragon voice recognition  software and may include unintentional dictation errors.    Rudene Re, MD 04/28/19 [place], [place], [place] 04/28/19 [phone]

## 2019-04-28 NOTE — ED Notes (Signed)
Pt dressed out into purple scrubs and nonskid socks by this RN and Kayla EDT- 3 black hair ties, 1 bracelet with pink gem, 1 black ring, and 2 gold in color rings placed in a small bag with pt belongings

## 2019-04-28 NOTE — ED Provider Notes (Signed)
-----------------------------------------   10:43 AM on 04/28/2019 -----------------------------------------  Patient care assumed from Dr. Alfred Levins.  Patient has been seen and evaluated by psychiatry.  They will be admitting to their service once a bed becomes available.  Patient is medically cleared at this time.  Cover test pending.   Harvest Dark, MD 04/28/19 1044

## 2019-04-28 NOTE — BH Assessment (Signed)
Assessment Note  Christina Cohen is an 33 y.o. female who presents to the ER due to an overdosed opiates and klonopin. Patient was recently in the ER for similar presentation and was discharge with safety plan. However, patient was found unresponsive in her bathroom with needles drug paraphernalia.   When patient was last in the ER she shared it was the anniversary of the death of her husband. Patient is high risk of hurting herself. Due the her impulsive, high risk behaviors; the lack of supervision her natural support is able to provide and the anniversary of her husband's death.  During the interview, the patient was lethargic and difficult to engage.   Diagnosis: Depression  Past Medical History: No past medical history on file.  Past Surgical History:  Procedure Laterality Date  . CESAREAN SECTION      Family History:  Family History  Problem Relation Age of Onset  . Diabetes Maternal Grandmother   . Thyroid disease Maternal Grandmother     Social History:  reports that she has been smoking cigarettes. She has a 10.00 pack-year smoking history. She has never used smokeless tobacco. She reports current drug use. She reports that she does not drink alcohol.  Additional Social History:  Alcohol / Drug Use Pain Medications: See PTA Prescriptions: See PTA Over the Counter: See PTA History of alcohol / drug use?: Yes Longest period of sobriety (when/how long): Unable to quantify Negative Consequences of Use: Personal relationships, Work / School  CIWA: CIWA-Ar BP: 110/75 Pulse Rate: 82 COWS:    Allergies: No Known Allergies  Home Medications: (Not in a hospital admission)   OB/GYN Status:  No LMP recorded (lmp unknown). Patient has had an ablation.  General Assessment Data [facility] Assessment: In system Is this a Tele or Face-to-Face Assessment?: Face-to-Face Is this an Initial Assessment or a Re-assessment for this encounter?: Initial  Assessment Language Other than English: No Living Arrangements: Other (Comment)(Private Home) What gender do you identify as?: Female Marital status: Widowed Pregnancy Status: No Living Arrangements: Other relatives Can pt return to current living arrangement?: Yes Admission Status: Involuntary Petitioner: Police Is patient capable of signing voluntary admission?: No(Under IVC) Referral Source: Self/Family/Friend Insurance type: None  Medical Screening Exam [facility] Walk-in ONLY) Medical Exam completed: Yes  Crisis Care Plan Living Arrangements: Other relatives Legal Guardian: Other:(Self) Name of Psychiatrist: [facility] Name of Therapist: [facility]  Education Status Is patient currently in school?: No Is the patient employed, unemployed or receiving disability?: Employed  Risk to self with the past 6 months Suicidal Ideation: No-Not Currently/Within Last 6 Months Has patient been a risk to self within the past 6 months prior to admission? : No Suicidal Intent: No-Not Currently/Within Last 6 Months Has patient had any suicidal intent within the past 6 months prior to admission? : No Is patient at risk for suicide?: Yes Suicidal Plan?: No-Not Currently/Within Last 6 Months Has patient had any suicidal plan within the past 6 months prior to admission? : Yes Access to Means: No What has been your use of drugs/alcohol within the last 12 months?: Amphetamines, Opiates, Benzodiazepine & Methadone Previous Attempts/Gestures: No How many times?: 0 Other Self Harm Risks: Active Drug use Triggers for Past Attempts: None known Intentional Self Injurious Behavior: None Family Suicide History: No Recent stressful life event(s): Other (Comment), Loss (Comment), Financial Problems, Conflict (Comment) Persecutory voices/beliefs?: No Depression: Yes Depression Symptoms: Isolating, Guilt Substance abuse history and/or treatment for substance abuse?: Yes Suicide prevention  information given to  non-admitted patients: Not applicable  Risk to Others within the past 6 months Homicidal Ideation: No Does patient have any lifetime risk of violence toward others beyond the six months prior to admission? : No Thoughts of Harm to Others: No Current Homicidal Intent: No Current Homicidal Plan: No Access to Homicidal Means: No Identified Victim: Reports of none History of harm to others?: No Assessment of Violence: None Noted Violent Behavior Description: Reports of none Does patient have access to weapons?: No Criminal Charges Pending?: No Describe Pending Criminal Charges: Reports of none Does patient have a court date: Yes Court Date: 06/27/19 Is patient on probation?: Yes  Psychosis Hallucinations: None noted Delusions: None noted  Mental Status Report Appearance/Hygiene: Unremarkable, In scrubs Eye Contact: Poor Motor Activity: Unable to assess Speech: Soft, Slow Level of Consciousness: Drowsy Mood: Depressed Affect: Depressed, Sad Anxiety Level: None Thought Processes: Unable to Assess Judgement: Impaired Orientation: Person, Place Obsessive Compulsive Thoughts/Behaviors: None  Cognitive Functioning Concentration: Decreased Memory: Recent Impaired, Remote Intact Is patient IDD: No Insight: Poor Impulse Control: Poor Appetite: Good Have you had any weight changes? : No Change Sleep: No Change Total Hours of Sleep: 6 Vegetative Symptoms: None  ADLScreening [facility]) Patient's cognitive ability adequate to safely complete daily activities?: Yes Patient able to express need for assistance with ADLs?: Yes Independently performs ADLs?: Yes (appropriate for developmental age)  Prior Inpatient Therapy Prior Inpatient Therapy: Yes Prior Therapy Dates: 07/2012 Prior Therapy Facilty/Provider(s): [facility] Reason for Treatment: Substance Abuse  Prior Outpatient Therapy Prior Outpatient Therapy: Yes Prior Therapy Dates: current Prior Therapy  Facilty/Provider(s): [facility] Reason for Treatment: depression and SA use Does patient have an ACCT team?: No Does patient have Intensive In-House Services?  : No Does patient have [facility] services? : No Does patient have P4CC services?: No  ADL Screening (condition at time of admission) Patient's cognitive ability adequate to safely complete daily activities?: Yes Is the patient deaf or have difficulty hearing?: No Does the patient have difficulty seeing, even when wearing glasses/contacts?: No Does the patient have difficulty concentrating, remembering, or making decisions?: No Patient able to express need for assistance with ADLs?: Yes Does the patient have difficulty dressing or bathing?: No Independently performs ADLs?: Yes (appropriate for developmental age) Does the patient have difficulty walking or climbing stairs?: No Weakness of Legs: None Weakness of Arms/Hands: None  Home Assistive Devices/Equipment Home Assistive Devices/Equipment: None  Therapy Consults (therapy consults require a physician order) PT Evaluation Needed: No OT Evalulation Needed: No SLP Evaluation Needed: No Abuse/Neglect Assessment (Assessment to be complete while patient is alone) Physical Abuse: Denies Verbal Abuse: Denies Sexual Abuse: Denies Exploitation of patient/patient's resources: Denies Self-Neglect: Denies Values / Beliefs Cultural Requests During Hospitalization: None Spiritual Requests During Hospitalization: None Consults Spiritual Care Consult Needed: No Social Work Consult Needed: No      Child/Adolescent Assessment Running Away Risk: Denies(Patient is an adult)  Disposition:  Disposition Initial Assessment Completed for this Encounter: Yes  On Site Evaluation by:   Reviewed with Physician:    Gunnar Fusi MS, LCAS, [facility], [place], CCSI Therapeutic Triage Specialist 04/28/2019 2:29 PM

## 2019-04-28 NOTE — ED Notes (Signed)
IVC 

## 2019-04-28 NOTE — ED Notes (Signed)
Report on Situation, Background, Assessment, and Recommendations received from Noel, RN.     

## 2019-04-29 ENCOUNTER — Inpatient Hospital Stay
Admission: AD | Admit: 2019-04-29 | Discharge: 2019-05-06 | DRG: 885 | Disposition: A | Discharge status: Home / Self Care | Payer: No Typology Code available for payment source | Source: Intra-hospital | Attending: Psychiatry | Admitting: Psychiatry

## 2019-04-29 ENCOUNTER — Other Ambulatory Visit: Payer: Self-pay

## 2019-04-29 DIAGNOSIS — F332 Major depressive disorder, recurrent severe without psychotic features: Principal | ICD-10-CM | POA: Diagnosis present

## 2019-04-29 DIAGNOSIS — R45851 Suicidal ideations: Secondary | ICD-10-CM | POA: Diagnosis present

## 2019-04-29 DIAGNOSIS — F17210 Nicotine dependence, cigarettes, uncomplicated: Secondary | ICD-10-CM | POA: Diagnosis present

## 2019-04-29 DIAGNOSIS — T401X1A Poisoning by heroin, accidental (unintentional), initial encounter: Secondary | ICD-10-CM | POA: Diagnosis present

## 2019-04-29 DIAGNOSIS — F1123 Opioid dependence with withdrawal: Secondary | ICD-10-CM | POA: Diagnosis present

## 2019-04-29 DIAGNOSIS — F419 Anxiety disorder, unspecified: Secondary | ICD-10-CM | POA: Diagnosis present

## 2019-04-29 DIAGNOSIS — F1110 Opioid abuse, uncomplicated: Secondary | ICD-10-CM

## 2019-04-29 LAB — GLUCOSE, CAPILLARY: Glucose-Capillary: 95 mg/dL (ref 70–99)

## 2019-04-29 MED ORDER — NAPROXEN 500 MG PO TABS
500.0000 mg | ORAL_TABLET | Freq: Two times a day (BID) | ORAL | Status: DC | PRN
  Filled 2019-04-29: qty 1

## 2019-04-29 MED ORDER — METHOCARBAMOL 500 MG PO TABS
500.0000 mg | ORAL_TABLET | Freq: Three times a day (TID) | ORAL | Status: DC | PRN
  Administered 2019-05-01: 500 mg via ORAL
  Filled 2019-04-29 (×2): qty 1

## 2019-04-29 MED ORDER — DICYCLOMINE HCL 20 MG PO TABS: 20.0000 mg | ORAL_TABLET | Freq: Four times a day (QID) | ORAL | Status: DC | PRN

## 2019-04-29 MED ORDER — CLONIDINE HCL 0.1 MG PO TABS: 0.1000 mg | ORAL_TABLET | ORAL | Status: DC

## 2019-04-29 MED ORDER — MAGNESIUM HYDROXIDE 400 MG/5ML PO SUSP: 30.0000 mL | Freq: Every day | ORAL | Status: DC | PRN

## 2019-04-29 MED ORDER — ACETAMINOPHEN 325 MG PO TABS
650.0000 mg | ORAL_TABLET | Freq: Four times a day (QID) | ORAL | Status: DC | PRN
  Administered 2019-05-04: 650 mg via ORAL
  Filled 2019-04-29: qty 2

## 2019-04-29 MED ORDER — GABAPENTIN 300 MG PO CAPS
300.0000 mg | ORAL_CAPSULE | Freq: Three times a day (TID) | ORAL | Status: DC
  Administered 2019-04-30: 300 mg via ORAL
  Filled 2019-04-29: qty 1

## 2019-04-29 MED ORDER — HYDROXYZINE HCL 25 MG PO TABS
25.0000 mg | ORAL_TABLET | Freq: Four times a day (QID) | ORAL | Status: AC | PRN
  Administered 2019-05-01 – 2019-05-04 (×6): 25 mg via ORAL
  Filled 2019-04-29 (×8): qty 1

## 2019-04-29 MED ORDER — CLONIDINE HCL 0.1 MG PO TABS
0.1000 mg | ORAL_TABLET | Freq: Four times a day (QID) | ORAL | Status: DC
  Administered 2019-04-30: 09:00:00 0.1 mg via ORAL
  Filled 2019-04-29: qty 1

## 2019-04-29 MED ORDER — ALUM & MAG HYDROXIDE-SIMETH 200-200-20 MG/5ML PO SUSP: 30.0000 mL | ORAL | Status: DC | PRN

## 2019-04-29 MED ORDER — ONDANSETRON 4 MG PO TBDP: 4.0000 mg | ORAL_TABLET | Freq: Four times a day (QID) | ORAL | Status: DC | PRN

## 2019-04-29 MED ORDER — LOPERAMIDE HCL 2 MG PO CAPS: 2.0000 mg | ORAL_CAPSULE | ORAL | Status: AC | PRN

## 2019-04-29 MED ORDER — CLONIDINE HCL 0.1 MG PO TABS: 0.1000 mg | ORAL_TABLET | Freq: Every day | ORAL | Status: DC

## 2019-04-29 NOTE — ED Notes (Signed)
BEHAVIORAL HEALTH ROUNDING Patient sleeping: Yes.   Patient alert and oriented: eyes closed  Appears asleep Behavior appropriate: Yes.  ; If no, describe:  Nutrition and fluids offered: Yes  Toileting and hygiene offered: sleeping Sitter present: q 15 minute observations and security camera monitoring  

## 2019-04-29 NOTE — ED Provider Notes (Signed)
-----------------------------------------   6:38 AM on 04/29/2019 -----------------------------------------   Blood pressure 127/85, pulse 64, temperature 98.8 F (37.1 C), temperature source Oral, resp. rate 15, SpO2 99 %.  The patient is sleeping at this time.  There have been no acute events since the last update.  Awaiting disposition plan from Behavioral Medicine.   Paulette Blanch, MD 04/29/19 [phone]

## 2019-04-29 NOTE — ED Notes (Signed)
This EDT collected this pt CBG, due on 04/28/2019 0100, with a result 95mg /dL. RN notified

## 2019-04-29 NOTE — ED Notes (Signed)
Patient still sleeping

## 2019-04-29 NOTE — ED Notes (Signed)
RN spoke with BMU. Patient will not be transferred until after 7pm tonight.

## 2019-04-29 NOTE — Progress Notes (Signed)
Patient transported to [facility] with [facility]. Paperwork sent with patient and given to [employer] for signing.

## 2019-04-29 NOTE — Progress Notes (Signed)
Orders received to transfer patient to BMU.

## 2019-04-29 NOTE — Progress Notes (Signed)
Report on Situation, Background, Assessment, and Recommendations received from Amy, RN. Pt. Observed asleep at this time.

## 2019-04-29 NOTE — ED Notes (Signed)
Pt transferred into ED BHU room 5    Patient assigned to appropriate care area. Patient oriented to unit/care area: Informed that, for her safety, care areas are designed for safety and monitored by security cameras at all times; Visiting hours and phone times explained to patient. Patient verbalizes understanding, and verbal contract for safety obtained.    She denies pain  Assessment completed  She is to be admitted to [facility]  - bed assigned  Delayed untill after change of shift

## 2019-04-29 NOTE — Progress Notes (Signed)
Report given to BMU RN.  

## 2019-04-29 NOTE — ED Notes (Signed)

## 2019-04-29 NOTE — ED Notes (Signed)
Referral information for Psychiatric Hospitalization faxed to;   . [facility] ([phone]-or- [phone]),   . [facility] (-[phone] -or- [phone]) [phone]  . [facility] ([phone]),   . [facility] ([phone] -or- [phone]),   . [facility] ([phone] or [phone])  . [facility] ([phone])  

## 2019-04-29 NOTE — Progress Notes (Signed)
Patient is a new admit , a 33 year old female admitted under involuntary commitment brought in by [employer]. , patient was found in the bath tube at the grandmothers home asleep and unresponsive , on her side is an intravenous niddle  EMS was called and patient was awakened but refused any care, patient is a known user of poly- substances , ranging from opioids, Klonopin, Amphetamines, Methadone, patient is determined to be a high risk oof hurting her self, patient stated that she is in morning the anniversary death of her husband , comfort and support is extended to patient . Unit safety and expected behaviors are discussed , low blood pressure is registered , but patient refused to take any fluids challenge to help with pressure. Patient refused nutritions or beverages and states I am not hungry. Body search and skin check is complete , no contraband found and skin is clean , unit and room orientation  Is complete , patient is placed close to nurses station for monitoring, and Dr. Weber Cooks will be the attending., patient is in bed relaxed and made comfortable with flat affect , mood is depressed, no distress.

## 2019-04-29 NOTE — BH Assessment (Signed)
Patient is to be admitted to [facility]U by Gust Rung, NP.  Attending Physician will be Dr. Weber Cooks.   Patient has been assigned to room 312, by [facility] Charge Nurse Demetria.   Intake Paper Work has been signed and placed on patient chart.   ER staff is aware of the admission:  Lattie Haw, ER Secretary    Dr. Myrene Buddy, ER MD   Amy B., Patient's Nurse   Meredith Mody, Patient Access.

## 2019-04-29 NOTE — ED Notes (Signed)
ED BHU PLACEMENT JUSTIFICATION Is the patient under IVC or is there intent for IVC: Yes.   Is the patient medically cleared: Yes.   Is there vacancy in the ED BHU: Yes.   Is the population mix appropriate for patient: Yes.   Is the patient awaiting placement in inpatient or outpatient setting: Yes.   Has the patient had a psychiatric consult: Yes.   Survey of unit performed for contraband, proper placement and condition of furniture, tampering with fixtures in bathroom, shower, and each patient room: Yes.  ; Findings:  APPEARANCE/BEHAVIOR Calm and cooperative NEURO ASSESSMENT Orientation: oriented x3  Denies pain Hallucinations: No.None noted (Hallucinations) denies Speech: Normal Gait: normal RESPIRATORY ASSESSMENT Even  Unlabored respirations  CARDIOVASCULAR ASSESSMENT Pulses equal   regular rate  Skin warm and dry   GASTROINTESTINAL ASSESSMENT no GI complaint EXTREMITIES Full ROM  PLAN OF CARE Provide calm/safe environment. Vital signs assessed twice daily. ED BHU Assessment once each 12-hour shift.  Assure the ED provider has rounded once each shift. Provide and encourage hygiene. Provide redirection as needed. Assess for escalating behavior; address immediately and inform ED provider.  Assess family dynamic and appropriateness for visitation as needed: Yes.  ; If necessary, describe findings:  Educate the patient/family about BHU procedures/visitation: Yes.  ; If necessary, describe findings:   

## 2019-04-29 NOTE — Tx Team (Signed)
Initial Treatment Plan 04/29/2019 10:03 PM Christina Cohen [id number]    PATIENT STRESSORS: Educational concerns Financial difficulties Marital or family conflict Occupational concerns   PATIENT STRENGTHS: Ability for insight Active sense of humor Communication skills Motivation for treatment/growth Physical Health   PATIENT IDENTIFIED PROBLEMS: Intentional over dose on drugs    Poly-substance Abuse    Depression/Anxiety    Grief dead husband          DISCHARGE CRITERIA:  Adequate post-discharge living arrangements Improved stabilization in mood, thinking, and/or behavior Motivation to continue treatment in a less acute level of care Reduction of life-threatening or endangering symptoms to within safe limits  PRELIMINARY DISCHARGE PLAN: Attend aftercare/continuing care group Attend 12-step recovery group Participate in family therapy Return to previous living arrangement  PATIENT/FAMILY INVOLVEMENT: This treatment plan has been presented to and reviewed with the patient, Christina Cohen,  The patient  have been given the opportunity to ask questions and make suggestions.  Clemens Catholic, RN 04/29/2019, 10:03 PM

## 2019-04-29 NOTE — ED Notes (Signed)
PT  MOVED  TO  BHU  UNIT 

## 2019-04-30 DIAGNOSIS — F1110 Opioid abuse, uncomplicated: Secondary | ICD-10-CM

## 2019-04-30 DIAGNOSIS — F332 Major depressive disorder, recurrent severe without psychotic features: Principal | ICD-10-CM

## 2019-04-30 DIAGNOSIS — R45851 Suicidal ideations: Secondary | ICD-10-CM

## 2019-04-30 MED ORDER — BUPRENORPHINE HCL-NALOXONE HCL 8-2 MG SL SUBL
1.0000 | SUBLINGUAL_TABLET | Freq: Every day | SUBLINGUAL | Status: AC
  Administered 2019-04-30 – 2019-05-01 (×2): 1 via SUBLINGUAL
  Filled 2019-04-30 (×2): qty 1

## 2019-04-30 MED ORDER — ADULT MULTIVITAMIN W/MINERALS CH
1.0000 | ORAL_TABLET | Freq: Every day | ORAL | Status: DC
  Administered 2019-05-01 – 2019-05-05 (×5): 1 via ORAL
  Filled 2019-04-30 (×5): qty 1

## 2019-04-30 MED ORDER — ENSURE ENLIVE PO LIQD
237.0000 mL | Freq: Two times a day (BID) | ORAL | Status: DC
  Administered 2019-04-30 – 2019-05-05 (×10): 237 mL via ORAL

## 2019-04-30 NOTE — Progress Notes (Signed)
Recreation Therapy Notes  INPATIENT RECREATION THERAPY ASSESSMENT  Patient Details Name: Christina Cohen MRN: [id number] DOB: 12-23-1985 Today's Date: 04/30/2019       Information Obtained From: Patient  Able to Participate in Assessment/Interview: Yes  Patient Presentation: Responsive  Reason for Admission (Per Patient): Active Symptoms, Substance Abuse  Patient Stressors: Death  Coping Skills:   Substance Abuse  Leisure Interests (2+):  (Nothing)  Frequency of Recreation/Participation:    Awareness of Community Resources:     [employer]:     Current Use:    If no, Barriers?:    Expressed Interest in [employer] Information:    [place] of Residence:  [place]  Patient Main Form of Transportation: Walk  Patient Strengths:  N/A  Patient Identified Areas of Improvement:  Many things  Patient Goal for Hospitalization:  To feel better  Current SI (including self-harm):  No  Current HI:  No  Current AVH: No  Staff Intervention Plan: Group Attendance, Collaborate with Interdisciplinary Treatment Team  Consent to Intern Participation: N/A  Valkyrie Guardiola 04/30/2019, 2:42 PM

## 2019-04-30 NOTE — Tx Team (Addendum)
Interdisciplinary Treatment and Diagnostic Plan Update  04/30/2019 Time of Session: [place] MRN: [id number]  Principal Diagnosis: <principal problem not specified>  Secondary Diagnoses: Active Problems:   Major depressive disorder, recurrent severe without psychotic features ([place])   Current Medications:  Current Facility-Administered Medications  Medication Dose Route Frequency Provider Last Rate Last Dose  . acetaminophen (TYLENOL) tablet 650 mg  650 mg Oral Q6H PRN Patrecia Pour, NP      . alum & mag hydroxide-simeth (MAALOX/MYLANTA) 200-200-20 MG/5ML suspension 30 mL  30 mL Oral Q4H PRN Patrecia Pour, NP      . cloNIDine (CATAPRES) tablet 0.1 mg  0.1 mg Oral QID Patrecia Pour, NP   0.1 mg at 04/30/19 0910   Followed by  . [START ON 05/02/2019] cloNIDine (CATAPRES) tablet 0.1 mg  0.1 mg Oral BH-qamhs Lord, Asa Saunas, NP       Followed by  . [START ON 05/05/2019] cloNIDine (CATAPRES) tablet 0.1 mg  0.1 mg Oral QAC breakfast Patrecia Pour, NP      . dicyclomine (BENTYL) tablet 20 mg  20 mg Oral Q6H PRN Patrecia Pour, NP      . gabapentin (NEURONTIN) capsule 300 mg  300 mg Oral TID Patrecia Pour, NP   300 mg at 04/30/19 0909  . hydrOXYzine (ATARAX/VISTARIL) tablet 25 mg  25 mg Oral Q6H PRN Patrecia Pour, NP      . loperamide (IMODIUM) capsule 2-4 mg  2-4 mg Oral PRN Patrecia Pour, NP      . magnesium hydroxide (MILK OF MAGNESIA) suspension 30 mL  30 mL Oral Daily PRN Patrecia Pour, NP      . methocarbamol (ROBAXIN) tablet 500 mg  500 mg Oral Q8H PRN Patrecia Pour, NP      . naproxen (NAPROSYN) tablet 500 mg  500 mg Oral BID PRN Patrecia Pour, NP      . ondansetron (ZOFRAN-ODT) disintegrating tablet 4 mg  4 mg Oral Q6H PRN Patrecia Pour, NP       PTA Medications: No medications prior to admission.    Patient Stressors: Network engineer difficulties Marital or family conflict Occupational concerns  Patient Strengths: Ability for  insight Active sense of humor Communication skills Motivation for treatment/growth Physical Health  Treatment Modalities: Medication Management, Group therapy, Case management,  1 to 1 session with clinician, Psychoeducation, Recreational therapy.   Physician Treatment Plan for Primary Diagnosis: <principal problem not specified> Long Term Goal(s):     Short Term Goals:    Medication Management: Evaluate patient's response, side effects, and tolerance of medication regimen.  Therapeutic Interventions: 1 to 1 sessions, Unit Group sessions and Medication administration.  Evaluation of Outcomes: Not Met  Physician Treatment Plan for Secondary Diagnosis: Active Problems:   Major depressive disorder, recurrent severe without psychotic features ([place])  Long Term Goal(s):     Short Term Goals:       Medication Management: Evaluate patient's response, side effects, and tolerance of medication regimen.  Therapeutic Interventions: 1 to 1 sessions, Unit Group sessions and Medication administration.  Evaluation of Outcomes: Not Met   RN Treatment Plan for Primary Diagnosis: <principal problem not specified> Long Term Goal(s): Knowledge of disease and therapeutic regimen to maintain health will improve  Short Term Goals: Ability to remain free from injury will improve, Ability to demonstrate self-control, Ability to participate in decision making will improve, Ability to disclose and discuss suicidal ideas and Ability  to identify and develop effective coping behaviors will improve  Medication Management: RN will administer medications as ordered by provider, will assess and evaluate patient's response and provide education to patient for prescribed medication. RN will report any adverse and/or side effects to prescribing provider.  Therapeutic Interventions: 1 on 1 counseling sessions, Psychoeducation, Medication administration, Evaluate responses to treatment, Monitor vital signs and  CBGs as ordered, Perform/monitor CIWA, COWS, AIMS and Fall Risk screenings as ordered, Perform wound care treatments as ordered.  Evaluation of Outcomes: Not Met   LCSW Treatment Plan for Primary Diagnosis: <principal problem not specified> Long Term Goal(s): Safe transition to appropriate next level of care at discharge, Engage patient in therapeutic group addressing interpersonal concerns.  Short Term Goals: Engage patient in aftercare planning with referrals and resources, Increase social support, Facilitate patient progression through stages of change regarding substance use diagnoses and concerns, Identify triggers associated with mental health/substance abuse issues and Increase skills for wellness and recovery  Therapeutic Interventions: Assess for all discharge needs, 1 to 1 time with Social worker, Explore available resources and support systems, Assess for adequacy in community support network, Educate family and significant other(s) on suicide prevention, Complete Psychosocial Assessment, Interpersonal group therapy.  Evaluation of Outcomes: Not Met   Progress in Treatment: Attending groups: No. Participating in groups: No. Taking medication as prescribed: Yes. Toleration medication: Yes. Family/Significant other contact made: No, will contact:  pt declined collateral contact Patient understands diagnosis: Yes. Discussing patient identified problems/goals with staff: Yes. Medical problems stabilized or resolved: Yes. Denies suicidal/homicidal ideation: Yes. Issues/concerns per patient self-inventory: No. Other: N/A  New problem(s) identified: No, Describe:  none  New Short Term/Long Term Goal(s): Detox, elimination of AVH/symptoms of psychosis, medication management for mood stabilization; elimination of SI thoughts; development of comprehensive mental wellness/sobriety plan.   Patient Goals:  "I just want to feel better"  Discharge Plan or Barriers: SPE pamphlet, [place]  Crisis information, and AA/NA information provided to patient for additional community support and resources. Pt is agreeable to referral to [facility]. CSW assessing for appropriate referrals.  Reason for Continuation of Hospitalization: Depression Medication stabilization Withdrawal symptoms  Estimated Length of Stay: 5-7 days  Recreational Therapy: Patient Stressors: Death Patient Goal: Patient will engage in groups without prompting or encouragement from LRT x3 group sessions within 5 recreation therapy group sessions   Attendees: Patient: Christina Cohen 04/30/2019 10:15 AM  Physician: Dr Weber Cooks MD 04/30/2019 10:15 AM  Nursing: Polly Cobia RN 04/30/2019 10:15 AM  RN Care Manager: 04/30/2019 10:15 AM  Social Worker: Minette Brine Moton LCSW 04/30/2019 10:15 AM  Recreational Therapist: Roanna Epley CTRS LRT 04/30/2019 10:15 AM  Other: Sanjuana Kava LCSW 04/30/2019 10:15 AM  Other:  04/30/2019 10:15 AM  Other: 04/30/2019 10:15 AM    Scribe for Treatment Team: Mariann Laster Moton, LCSW 04/30/2019 10:15 AM

## 2019-04-30 NOTE — [facility] Group Notes (Signed)
  LCSW Group Therapy Note  04/30/2019 12:18 PM   Type of Therapy/Topic:  Group Therapy:  Feelings about Diagnosis  Participation Level:  Did Not Attend   Description of Group:   This group will allow patients to explore their thoughts and feelings about diagnoses they have received. Patients will be guided to explore their level of understanding and acceptance of these diagnoses. Facilitator will encourage patients to process their thoughts and feelings about the reactions of others to their diagnosis and will guide patients in identifying ways to discuss their diagnosis with significant others in their lives. This group will be process-oriented, with patients participating in exploration of their own experiences, giving and receiving support, and processing challenge from other group members.   Therapeutic Goals: 1. Patient will demonstrate understanding of diagnosis as evidenced by identifying two or more symptoms of the disorder 2. Patient will be able to express two feelings regarding the diagnosis 3. Patient will demonstrate their ability to communicate their needs through discussion and/or role play  Summary of Patient Progress: x   Therapeutic Modalities:   Cognitive Behavioral Therapy Brief Therapy Feelings Identification    Evalina Field, MSW, LCSW Clinical Social Work 04/30/2019 12:18 PM

## 2019-04-30 NOTE — Plan of Care (Signed)
Pt rates depression 7/10 and anxiety 8/10. Pt denies SI, HI and AVH. Pt was educated on care plan and verbalizes understanding. Collier Bullock RN Problem: Education: Goal: Ability to make informed decisions regarding treatment will improve Outcome: Progressing   Problem: Coping: Goal: Coping ability will improve Outcome: Progressing   Problem: Health Behavior/Discharge Planning: Goal: Identification of resources available to assist in meeting health care needs will improve Outcome: Progressing   Problem: Medication: Goal: Compliance with prescribed medication regimen will improve Outcome: Progressing   Problem: Self-Concept: Goal: Ability to disclose and discuss suicidal ideas will improve Outcome: Progressing Goal: Will verbalize positive feelings about self Outcome: Progressing   Problem: Education: Goal: Utilization of techniques to improve thought processes will improve Outcome: Progressing Goal: Knowledge of the prescribed therapeutic regimen will improve Outcome: Progressing   Problem: Coping: Goal: Coping ability will improve Outcome: Progressing Goal: Will verbalize feelings Outcome: Progressing   Problem: Health Behavior/Discharge Planning: Goal: Ability to make decisions will improve Outcome: Progressing Goal: Compliance with therapeutic regimen will improve Outcome: Progressing   Problem: Safety: Goal: Ability to disclose and discuss suicidal ideas will improve Outcome: Progressing   Problem: Self-Concept: Goal: Level of anxiety will decrease Outcome: Progressing   Problem: Self-Concept: Goal: Ability to identify factors that promote anxiety will improve Outcome: Progressing Goal: Level of anxiety will decrease Outcome: Progressing Goal: Ability to modify response to factors that promote anxiety will improve Outcome: Progressing

## 2019-04-30 NOTE — H&P (Signed)
Psychiatric Admission Assessment Adult  Patient Identification: Christina Cohen MRN:  [id number] Date of Evaluation:  04/30/2019 Chief Complaint:  MDD Principal Diagnosis: Major depressive disorder, recurrent severe without psychotic features (HCC) Diagnosis:  Principal Problem:   Major depressive disorder, recurrent severe without psychotic features (HCC) Active Problems:   Opiate abuse, continuous (HCC)   Suicidal ideation  History of Present Illness: Patient seen and chart reviewed.  Patient was brought to the emergency room after law enforcement and EMS found the patient passed out in a bathtub apparently from a narcotic overdose.  This is her second near fatal narcotic overdose just in the last 3 days.  Patient was revived and brought to the emergency room.  She reports depressed mood feeling hopeless feeling sick of her life of drug abuse.  Admits to daily heroin use mostly IV.  Drug screen also positive for amphetamines and benzodiazepines and methadone.  Not drinking alcohol.  Patient is homeless seems to move from just one place to another.  Out-of-control lifestyle currently.  She denies that anything about the overdose however was suicidal or intentional and denies any current suicidal ideation.  Denies any psychotic symptoms.  Not currently receiving any outpatient treatment. Associated Signs/Symptoms: Depression Symptoms:  depressed mood, anhedonia, insomnia, psychomotor retardation, difficulty concentrating, hopelessness, (Hypo) Manic Symptoms:  None Anxiety Symptoms:  Not specific Psychotic Symptoms:  None PTSD Symptoms: Negative Total Time spent with patient: 1 hour  Past Psychiatric History: Patient has no previous hospitalizations psychiatrically.  No previous medications.  Not been involved in any detox or rehab treatment in the past.  Denies ever having tried to kill her self.  Is the patient at risk to self? Yes.    Has the patient been a risk to self in the past 6  months? Yes.    Has the patient been a risk to self within the distant past? Yes.    Is the patient a risk to others? No.  Has the patient been a risk to others in the past 6 months? No.  Has the patient been a risk to others within the distant past? No.   Prior Inpatient Therapy:   Prior Outpatient Therapy:    Alcohol Screening: 1. How often do you have a drink containing alcohol?: Monthly or less 2. How many drinks containing alcohol do you have on a typical day when you are drinking?: 3 or 4 3. How often do you have six or more drinks on one occasion?: Less than monthly AUDIT-C Score: 3 4. How often during the last year have you found that you were not able to stop drinking once you had started?: Less than monthly 5. How often during the last year have you failed to do what was normally expected from you becasue of drinking?: Less than monthly 6. How often during the last year have you needed a first drink in the morning to get yourself going after a heavy drinking session?: Less than monthly 7. How often during the last year have you had a feeling of guilt of remorse after drinking?: Less than monthly 8. How often during the last year have you been unable to remember what happened the night before because you had been drinking?: Monthly 9. Have you or someone else been injured as a result of your drinking?: No 10. Has a relative or friend or a doctor or another health worker been concerned about your drinking or suggested you cut down?: No Alcohol Use Disorder Identification Test Final Score (AUDIT):  9 Alcohol Brief Interventions/Follow-up: Alcohol Education Substance Abuse History in the last 12 months:  Yes.   Consequences of Substance Abuse: Medical Consequences:  Near fatal overdoses from ongoing IV drug abuse Previous Psychotropic Medications: No  Psychological Evaluations: No  Past Medical History: History reviewed. No pertinent past medical history.  Past Surgical History:   Procedure Laterality Date  . CESAREAN SECTION     Family History:  Family History  Problem Relation Age of Onset  . Diabetes Maternal Grandmother   . Thyroid disease Maternal Grandmother    Family Psychiatric  History: Denies any Tobacco Screening: Have you used any form of tobacco in the last 30 days? (Cigarettes, Smokeless Tobacco, Cigars, and/or Pipes): Yes Tobacco use, Select all that apply: 5 or more cigarettes per day Are you interested in Tobacco Cessation Medications?: Yes, will notify MD for an order Counseled patient on smoking cessation including recognizing danger situations, developing coping skills and basic information about quitting provided: Yes Social History:  Social History   Substance and Sexual Activity  Alcohol Use No  . Alcohol/week: 0.0 standard drinks     Social History   Substance and Sexual Activity  Drug Use Yes    Additional Social History: Marital status: Widowed Widowed, when?: 1 year Does patient have children?: Yes How many children?: 3 How is patient's relationship with their children?: pt does not have custody of her children and reports her relationship with them could be better                         Allergies:  No Known Allergies Lab Results:  Results for orders placed or performed during the hospital encounter of 04/28/19 (from the past 48 hour(s))  Glucose, capillary     Status: None   Collection Time: 04/29/19 12:52 AM  Result Value Ref Range   Glucose-Capillary 95 70 - 99 mg/dL    Blood Alcohol level:  Lab Results  Component Value Date   ETH <10 04/28/2019   ETH <10 04/25/2019    Metabolic Disorder Labs:  No results found for: HGBA1C, MPG No results found for: PROLACTIN No results found for: CHOL, TRIG, HDL, CHOLHDL, VLDL, LDLCALC  Current Medications: Current Facility-Administered Medications  Medication Dose Route Frequency Provider Last Rate Last Dose  . acetaminophen (TYLENOL) tablet 650 mg  650 mg  Oral Q6H PRN Charm RingsLord, Jamison Y, NP      . alum & mag hydroxide-simeth (MAALOX/MYLANTA) 200-200-20 MG/5ML suspension 30 mL  30 mL Oral Q4H PRN Charm RingsLord, Jamison Y, NP      . buprenorphine-naloxone (SUBOXONE) 8-2 mg per SL tablet 1 tablet  1 tablet Sublingual Daily Archie Shea, Jackquline DenmarkJohn T, MD   1 tablet at 04/30/19 1315  . feeding supplement (ENSURE ENLIVE) (ENSURE ENLIVE) liquid 237 mL  237 mL Oral BID BM Barney Gertsch T, MD      . hydrOXYzine (ATARAX/VISTARIL) tablet 25 mg  25 mg Oral Q6H PRN Charm RingsLord, Jamison Y, NP      . loperamide (IMODIUM) capsule 2-4 mg  2-4 mg Oral PRN Charm RingsLord, Jamison Y, NP      . magnesium hydroxide (MILK OF MAGNESIA) suspension 30 mL  30 mL Oral Daily PRN Charm RingsLord, Jamison Y, NP      . methocarbamol (ROBAXIN) tablet 500 mg  500 mg Oral Q8H PRN Charm RingsLord, Jamison Y, NP      . Melene Muller[START ON 05/01/2019] multivitamin with minerals tablet 1 tablet  1 tablet Oral Daily Adyline Huberty T,  MD       PTA Medications: No medications prior to admission.    Musculoskeletal: Strength & Muscle Tone: decreased Gait & Station: unsteady Patient leans: N/A  Psychiatric Specialty Exam: Physical Exam  Nursing note and vitals reviewed. Constitutional: She appears well-developed and well-nourished.  HENT:  Head: Normocephalic and atraumatic.  Eyes: Pupils are equal, round, and reactive to light. Conjunctivae are normal.  Neck: Normal range of motion.  Cardiovascular: Regular rhythm and normal heart sounds.  Respiratory: Effort normal.  GI: Soft.  Musculoskeletal: Normal range of motion.  Neurological: She is alert.  Skin: Skin is warm and dry.  Psychiatric: Her affect is blunt. Her speech is delayed. She is slowed. She expresses impulsivity. She expresses no homicidal and no suicidal ideation. She exhibits abnormal recent memory.    Review of Systems  Constitutional: Positive for chills and malaise/fatigue.  HENT: Negative.   Eyes: Negative.   Respiratory: Negative.   Cardiovascular: Negative.    Gastrointestinal: Positive for diarrhea and nausea.  Musculoskeletal: Positive for myalgias.  Skin: Negative.   Neurological: Negative.   Psychiatric/Behavioral: Positive for depression and substance abuse. Negative for hallucinations and suicidal ideas. The patient is nervous/anxious and has insomnia.     Blood pressure 101/74, pulse 80, temperature 97.8 F (36.6 C), temperature source Oral, resp. rate 17, height 5\' 4"  (1.626 m), weight 49 kg, SpO2 100 %.Body mass index is 18.54 kg/m.  General Appearance: Disheveled  Eye Contact:  Fair  Speech:  Slow  Volume:  Decreased  Mood:  Dysphoric  Affect:  Blunt  Thought Process:  Coherent  Orientation:  Full (Time, Place, and Person)  Thought Content:  Logical and Rumination  Suicidal Thoughts:  No  Homicidal Thoughts:  No  Memory:  Immediate;   Fair Recent;   Fair Remote;   Fair  Judgement:  Impaired  Insight:  Shallow  Psychomotor Activity:  Decreased  Concentration:  Concentration: Fair  Recall:  [employer] of Knowledge:  Fair  Language:  Fair  Akathisia:  No  Handed:  Right  AIMS (if indicated):     Assets:  Desire for Improvement  ADL's:  Impaired  Cognition:  Impaired,  Mild  Sleep:  Number of Hours: 7.5    Treatment Plan Summary: Daily contact with patient to assess and evaluate symptoms and progress in treatment, Medication management and Plan Patient primarily appears to have an out-of-control drug problem with resultant terrible mood and hopelessness because of how deprived her life has become.  Currently having narcotic withdrawal.  I have prescribed Suboxone with a plan of only 3 days of treatment for detox.  Not clear that she needs any other psychiatric medicine at this point.  Will defer making any other decisions for treating mood until she is more comfortable going through detox.  Patient can be reassessed and we can work on appropriate discharge planning after confirming that she is in her right mind and not  suicidal.  Observation Level/Precautions:  15 minute checks  Laboratory:  UDS  Psychotherapy:    Medications:    Consultations:    Discharge Concerns:    Estimated LOS:  Other:     Physician Treatment Plan for Primary Diagnosis: Major depressive disorder, recurrent severe without psychotic features ([place]) Long Term Goal(s): Improvement in symptoms so as ready for discharge  Short Term Goals: Ability to verbalize feelings will improve and Ability to demonstrate self-control will improve  Physician Treatment Plan for Secondary Diagnosis: Principal Problem:   Major depressive  disorder, recurrent severe without psychotic features (HCC) Active Problems:   Opiate abuse, continuous (HCC)   Suicidal ideation  Long Term Goal(s): Improvement in symptoms so as ready for discharge  Short Term Goals: Ability to identify triggers associated with substance abuse/mental health issues will improve  I certify that inpatient services furnished can reasonably be expected to improve the patient's condition.    Mordecai RasmussenJohn Zaylin Runco, MD 9/1/20202:23 PM

## 2019-04-30 NOTE — Progress Notes (Signed)
Recreation Therapy Notes    Date: 04/30/2019  Time: 9:30 am   Location: Craft room   Behavioral response: N/A   Intervention Topic: Necessities    Discussion/Intervention: Patient did not attend group.   Clinical Observations/Feedback:  Patient did not attend group.   Mariana Wiederholt LRT/CTRS        Mame Twombly 04/30/2019 11:14 AM

## 2019-04-30 NOTE — BHH Suicide Risk Assessment (Signed)
[facility] Admission Suicide Risk Assessment   Nursing information obtained from:  Patient Demographic factors:  NA Current Mental Status:  NA Loss Factors:  NA Historical Factors:  NA Risk Reduction Factors:  NA  Total Time spent with patient: 1 hour Principal Problem: Major depressive disorder, recurrent severe without psychotic features ([place]) Diagnosis:  Principal Problem:   Major depressive disorder, recurrent severe without psychotic features ([place]) Active Problems:   Opiate abuse, continuous ([place])   Suicidal ideation  Subjective Data: Patient seen and chart reviewed.  Patient brought to the emergency room after being found nearly dead from narcotic overdose.  This is her second near fatal overdose in just a few days.  Patient today says she is feeling achy all over sick to her stomach but also feeling depressed and sick and tired of substance abuse.  Denies acute suicidal intent.  Denies any psychotic symptoms.  Continued Clinical Symptoms:  Alcohol Use Disorder Identification Test Final Score (AUDIT): 9 The "Alcohol Use Disorders Identification Test", Guidelines for Use in Primary Care, Second Edition.  World Pharmacologist [facility]). Score between 0-7:  no or low risk or alcohol related problems. Score between 8-15:  moderate risk of alcohol related problems. Score between 16-19:  high risk of alcohol related problems. Score 20 or above:  warrants further diagnostic evaluation for alcohol dependence and treatment.   CLINICAL FACTORS:   Alcohol/Substance Abuse/Dependencies   Musculoskeletal: Strength & Muscle Tone: within normal limits Gait & Station: normal Patient leans: N/A  Psychiatric Specialty Exam: Physical Exam  Nursing note and vitals reviewed. Constitutional: She appears well-developed and well-nourished.  HENT:  Head: Normocephalic and atraumatic.  Eyes: Pupils are equal, round, and reactive to light. Conjunctivae are normal.  Neck: Normal range of motion.   Cardiovascular: Regular rhythm and normal heart sounds.  Respiratory: Effort normal. No respiratory distress.  GI: Soft.  Musculoskeletal: Normal range of motion.  Neurological: She is alert.  Skin: Skin is warm and dry.  Psychiatric: Her affect is blunt. Her speech is delayed. She is slowed and withdrawn. Cognition and memory are impaired. She expresses impulsivity. She expresses no homicidal and no suicidal ideation.    Review of Systems  Constitutional: Positive for chills and malaise/fatigue.  HENT: Negative.   Eyes: Negative.   Respiratory: Negative.   Cardiovascular: Negative.   Gastrointestinal: Positive for diarrhea and nausea.  Musculoskeletal: Positive for myalgias.  Skin: Negative.   Neurological: Negative.   Psychiatric/Behavioral: Positive for depression, memory loss and substance abuse. Negative for hallucinations and suicidal ideas. The patient is nervous/anxious and has insomnia.     Blood pressure 101/74, pulse 80, temperature 97.8 F (36.6 C), temperature source Oral, resp. rate 17, height '5\' 4"'  (1.626 m), weight 49 kg, SpO2 100 %.Body mass index is 18.54 kg/m.  General Appearance: Disheveled  Eye Contact:  Minimal  Speech:  Slow  Volume:  Decreased  Mood:  Dysphoric  Affect:  Congruent  Thought Process:  Coherent  Orientation:  Full (Time, Place, and Person)  Thought Content:  Logical  Suicidal Thoughts:  No  Homicidal Thoughts:  No  Memory:  Immediate;   Fair Recent;   Fair Remote;   Fair  Judgement:  Fair  Insight:  Fair  Psychomotor Activity:  Decreased  Concentration:  Concentration: Poor  Recall:  [employer] of Knowledge:  Fair  Language:  Fair  Akathisia:  No  Handed:  Right  AIMS (if indicated):     Assets:  Desire for Improvement  ADL's:  Impaired  Cognition:  Impaired,  Mild  Sleep:  Number of Hours: 7.5      COGNITIVE FEATURES THAT CONTRIBUTE TO RISK:  Loss of executive function    SUICIDE RISK:   Mild:  Suicidal ideation of  limited frequency, intensity, duration, and specificity.  There are no identifiable plans, no associated intent, mild dysphoria and related symptoms, good self-control (both objective and subjective assessment), few other risk factors, and identifiable protective factors, including available and accessible social support.  PLAN OF CARE: Patient admitted to the psychiatric ward.  She will be treated for opiate detox.  She will be engaged in groups and individual counseling as appropriate.  She met with treatment team today.  Once she is physically more stable we can discuss whether there are symptoms of underlying depression or what the appropriate discharge plan would be.  Reassess suicidality prior to discharge  I certify that inpatient services furnished can reasonably be expected to improve the patient's condition.   Alethia Berthold, MD 04/30/2019, 2:19 PM

## 2019-04-30 NOTE — Progress Notes (Signed)
Pt has been isolative in her room all day. She states that she feels better.She did drink an Ensure but that is it.  Collier Bullock RN
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
## 2019-04-30 NOTE — Progress Notes (Signed)
Initial Nutrition Assessment  DOCUMENTATION CODES:   Underweight  INTERVENTION:   Ensure Enlive po BID, each supplement provides 350 kcal and 20 grams of protein  MVI daily   NUTRITION DIAGNOSIS:   Predicted suboptimal nutrient intake related to social / environmental circumstances(substance abuse) as evidenced by other (comment)(per chart review).  GOAL:   Patient will meet greater than or equal to 90% of their needs  MONITOR:   PO intake, Supplement acceptance  REASON FOR ASSESSMENT:   Malnutrition Screening Tool    ASSESSMENT:   33 y.o. female with a history of polysubstance abuse who presents IVC by police for an overdose.   Suspect pt with poor appetite and oral intake pta r/t substance abuse. Pt is underweight. RD will add supplements and MVI to help pt meet her estimated needs. Per chart, pt's weight has fluctuated but appears fairly weight stable pta.   Medications and labs reviewed   Diet Order:   Diet Order            Diet regular Room service appropriate? Yes; Fluid consistency: Thin  Diet effective now             EDUCATION NEEDS:   No education needs have been identified at this time  Skin:  Skin Assessment: Reviewed RN Assessment  Last BM:  pta  Height:   Ht Readings from Last 1 Encounters:  04/29/19 5\' 4"  (1.626 m)    Weight:   Wt Readings from Last 1 Encounters:  04/29/19 49 kg    Ideal Body Weight:  54.5 kg  BMI:  Body mass index is 18.54 kg/m.  Estimated Nutritional Needs:   Kcal:  1500-1700kcal/day  Protein:  75-85g/day  Fluid:  >1.5L/day  Koleen Distance MS, RD, LDN Pager #- [phone] Office#- [phone] After Hours Pager: [phone]

## 2019-04-30 NOTE — BHH Suicide Risk Assessment (Signed)
[place] INPATIENT:  Family/Significant Other Suicide Prevention Education  Suicide Prevention Education:  Patient Refusal for Family/Significant Other Suicide Prevention Education: The patient Christina Cohen has refused to provide written consent for family/significant other to be provided Family/Significant Other Suicide Prevention Education during admission and/or prior to discharge.  Physician notified.  SPE completed with pt, as pt refused to consent to family contact. SPI pamphlet provided to pt and pt was encouraged to share information with support network, ask questions, and talk about any concerns relating to SPE. Pt denies access to guns/firearms and verbalized understanding of information provided. Mobile Crisis information also provided to pt.   [place] MSW LCSW 04/30/2019, 10:58 AM

## 2019-05-01 MED ORDER — BUPRENORPHINE HCL-NALOXONE HCL 8-2 MG SL SUBL
1.0000 | SUBLINGUAL_TABLET | Freq: Every day | SUBLINGUAL | Status: AC
  Administered 2019-05-01: 21:00:00 1 via SUBLINGUAL
  Filled 2019-05-01: qty 1

## 2019-05-01 MED ORDER — BUPRENORPHINE HCL-NALOXONE HCL 2-0.5 MG SL SUBL
2.0000 | SUBLINGUAL_TABLET | Freq: Two times a day (BID) | SUBLINGUAL | Status: AC
  Administered 2019-05-02 (×2): 2 via SUBLINGUAL
  Filled 2019-05-01 (×2): qty 2

## 2019-05-01 NOTE — Plan of Care (Signed)
  Problem: Education: Goal: Ability to make informed decisions regarding treatment will improve Outcome: Progressing  Patient has ability of informed decisions regarding treatment plan.

## 2019-05-01 NOTE — Progress Notes (Signed)
D: Pt denies SI/HI/AV hallucinations. Pt is pleasant and cooperative. Pt talked about wanted to get in a treatment center for help with her drug use. She also talked about her children and not having custody.  A: Pt was offered support and encouragement. Pt was given scheduled medications. Pt was encourage to attend groups. Q 15 minute checks were done for safety.  R:Pt attends groups and interacts well with peers and staff. Pt is taking medication. Pt has no complaints.Pt receptive to treatment and safety maintained on unit.

## 2019-05-01 NOTE — Plan of Care (Signed)
Pt out in the milieu socializing with peers. Pt endorses anxiety 5/10, but denies SI/HI/AVH. Pt med compliant. Pt talked about hoping to go to [facility] for rehab. Q15 minutes safety checks maintained. Prn anxiety medication given effective. Will continue to monitor.  Problem: Education: Goal: Ability to make informed decisions regarding treatment will improve Outcome: Progressing   Problem: Coping: Goal: Coping ability will improve Outcome: Progressing   Problem: Health Behavior/Discharge Planning: Goal: Identification of resources available to assist in meeting health care needs will improve Outcome: Progressing   Problem: Medication: Goal: Compliance with prescribed medication regimen will improve Outcome: Progressing   Problem: Self-Concept: Goal: Ability to disclose and discuss suicidal ideas will improve Outcome: Progressing Goal: Will verbalize positive feelings about self Outcome: Progressing   Problem: Education: Goal: Utilization of techniques to improve thought processes will improve Outcome: Progressing Goal: Knowledge of the prescribed therapeutic regimen will improve Outcome: Progressing   Problem: Activity: Goal: Interest or engagement in leisure activities will improve Outcome: Progressing Goal: Imbalance in normal sleep/wake cycle will improve Outcome: Progressing   Problem: Coping: Goal: Coping ability will improve Outcome: Progressing Goal: Will verbalize feelings Outcome: Progressing   Problem: Health Behavior/Discharge Planning: Goal: Ability to make decisions will improve Outcome: Progressing Goal: Compliance with therapeutic regimen will improve Outcome: Progressing   Problem: Role Relationship: Goal: Will demonstrate positive changes in social behaviors and relationships Outcome: Progressing   Problem: Safety: Goal: Ability to disclose and discuss suicidal ideas will improve Outcome: Progressing Goal: Ability to identify and utilize support  systems that promote safety will improve Outcome: Progressing   Problem: Self-Concept: Goal: Will verbalize positive feelings about self Outcome: Progressing Goal: Level of anxiety will decrease Outcome: Progressing   Problem: Education: Goal: Ability to state activities that reduce stress will improve Outcome: Progressing   Problem: Coping: Goal: Ability to identify and develop effective coping behavior will improve Outcome: Progressing   Problem: Self-Concept: Goal: Ability to identify factors that promote anxiety will improve Outcome: Progressing Goal: Level of anxiety will decrease Outcome: Progressing Goal: Ability to modify response to factors that promote anxiety will improve Outcome: Progressing

## 2019-05-01 NOTE — BHH Counselor (Signed)
CSW called [facility] to provide authorization number. CSW checked to see if referral has been reviewed and was informed that it has not.   Assunta Curtis, MSW, LCSW 05/01/2019 11:28 AM

## 2019-05-01 NOTE — Plan of Care (Signed)
  Problem: Coping: Goal: Coping ability will improve Outcome: Progressing Note: Patient does come out in milieu   Problem: Medication: Goal: Compliance with prescribed medication regimen will improve Outcome: Progressing Note: Patient is taking medications as prescribed   Problem: Self-Concept: Goal: Ability to disclose and discuss suicidal ideas will improve Outcome: Progressing Note: Patient denies SI

## 2019-05-01 NOTE — Progress Notes (Signed)
Patient alert and oriented x 4, affect is flat and sad she appears sad and in distress, VS WNL she stated " I don't feel good l am in withdrawal from drugs"  Patient was given some medications for S/S for withdrawal and also some Gatorade  and water. Patient appears frail she was encouraged to eat some snacks and she was receptive to staff. patient rated depression a 5/10 on a scale (0 low - 10 high ) she denies SI/HI/AVH, 15 minutes safety checks maintained will continue to monitor.

## 2019-05-01 NOTE — BHH Group Notes (Signed)
LCSW Group Therapy Note  05/01/2019 1:00 PM  Type of Therapy/Topic:  Group Therapy:  Emotion Regulation  Participation Level:  Did Not Attend   Description of Group:   The purpose of this group is to assist patients in learning to regulate negative emotions and experience positive emotions. Patients will be guided to discuss ways in which they have been vulnerable to their negative emotions. These vulnerabilities will be juxtaposed with experiences of positive emotions or situations, and patients will be challenged to use positive emotions to combat negative ones. Special emphasis will be placed on coping with negative emotions in conflict situations, and patients will process healthy conflict resolution skills.  Therapeutic Goals: 1. Patient will identify two positive emotions or experiences to reflect on in order to balance out negative emotions 2. Patient will label two or more emotions that they find the most difficult to experience 3. Patient will demonstrate positive conflict resolution skills through discussion and/or role plays  Summary of Patient Progress: X  Therapeutic Modalities:   Cognitive Behavioral Therapy Feelings Identification Dialectical Behavioral Therapy  Fabienne Nolasco, MSW, LCSW 05/01/2019 12:32 PM  

## 2019-05-01 NOTE — Progress Notes (Signed)
Recreation Therapy Notes  Date: 05/01/2019  Time: 9:30 am  Location: Craft room  Behavioral response: Appropriate   Intervention Topic: Goals  Discussion/Intervention:  Group content on today was focused on goals. Patients described what goals are and how they define goals. Individuals expressed how they go about setting goals and reaching them. The group identified how important goals are and if they make short term goals to reach long term goals. Patients described how many goals they work on at a time and what affects them not reaching their goal. Individuals described how much time they put into planning and obtaining their goals. The group participated in the intervention "My Goal Board" and made personal goal boards to help them achieve their goal Clinical Observations/Feedback:  Patient came to group and defined goals as something you set for yourself. She identified staying clean and detoxing to go to a program as goals for herself. Participant expressed that longest she has been clean was 75months when she was locked up. Patient stated that fear and a negative environment keeps her from reaching her goals. Individual was social with peers and staff while participating in group.  Christina Cohen LRT/CTRS          Christina Cohen 05/01/2019 11:11 AM

## 2019-05-01 NOTE — Progress Notes (Signed)
[facility]  05/01/2019 3:46 PM Christina Cohen  MRN:  [id number] Subjective: Follow-up for this patient with depression and opiate abuse.  Patient in bed most of the day.  Continues to complain of symptoms of heroin withdrawal.  Not feeling hungry.  Stomach upset.  Achy all over jittery.  Patient's emotional state appears fairly calm.  She says her mood is okay.  Denies suicidal thoughts.  She asks that we consider finding some sort of rehab program for her. Principal Problem: Major depressive disorder, recurrent severe without psychotic features (HCC) Diagnosis: Principal Problem:   Major depressive disorder, recurrent severe without psychotic features (HCC) Active Problems:   Opiate abuse, continuous (HCC)   Suicidal ideation  Total Time spent with patient: 30 minutes  Past Psychiatric History: Patient has a long history of opiate abuse  Past Medical History: History reviewed. No pertinent past medical history.  Past Surgical History:  Procedure Laterality Date  . CESAREAN SECTION     Family History:  Family History  Problem Relation Age of Onset  . Diabetes Maternal Grandmother   . Thyroid disease Maternal Grandmother    Family Psychiatric  History: See previous Social History:  Social History   Substance and Sexual Activity  Alcohol Use No  . Alcohol/week: 0.0 standard drinks     Social History   Substance and Sexual Activity  Drug Use Yes    Social History   Socioeconomic History  . Marital status: Legally Separated    Spouse name: Not on file  . Number of children: Not on file  . Years of education: Not on file  . Highest education level: Not on file  Occupational History  . Not on file  Social Needs  . Financial resource strain: Not on file  . Food insecurity    Worry: Not on file    Inability: Not on file  . Transportation needs    Medical: Not on file    Non-medical: Not on file  Tobacco Use  . Smoking status: Current Every Day Smoker     Packs/day: 1.00    Years: 10.00    Pack years: 10.00    Types: Cigarettes  . Smokeless tobacco: Never Used  Substance and Sexual Activity  . Alcohol use: No    Alcohol/week: 0.0 standard drinks  . Drug use: Yes  . Sexual activity: Not on file  Lifestyle  . Physical activity    Days per week: Not on file    Minutes per session: Not on file  . Stress: Not on file  Relationships  . Social Musicianconnections    Talks on phone: Not on file    Gets together: Not on file    Attends religious service: Not on file    Active member of club or organization: Not on file    Attends meetings of clubs or organizations: Not on file    Relationship status: Not on file  Other Topics Concern  . Not on file  Social History Narrative  . Not on file   Additional Social History:                         Sleep: Fair  Appetite:  Poor  Current Medications: Current Facility-Administered Medications  Medication Dose Route Frequency Provider Last Rate Last Dose  . acetaminophen (TYLENOL) tablet 650 mg  650 mg Oral Q6H PRN Charm RingsLord, Jamison Y, NP      . alum & mag hydroxide-simeth (MAALOX/MYLANTA)  200-200-20 MG/5ML suspension 30 mL  30 mL Oral Q4H PRN Charm Rings, NP      . Melene Muller ON 05/02/2019] buprenorphine-naloxone (SUBOXONE) 2-0.5 mg per SL tablet 2 tablet  2 tablet Sublingual BID Faaris Arizpe T, MD      . buprenorphine-naloxone (SUBOXONE) 8-2 mg per SL tablet 1 tablet  1 tablet Sublingual QHS Clarrisa Kaylor T, MD      . feeding supplement (ENSURE ENLIVE) (ENSURE ENLIVE) liquid 237 mL  237 mL Oral BID BM Thomasene Dubow T, MD   237 mL at 04/30/19 1630  . hydrOXYzine (ATARAX/VISTARIL) tablet 25 mg  25 mg Oral Q6H PRN Charm Rings, NP   25 mg at 05/01/19 0154  . loperamide (IMODIUM) capsule 2-4 mg  2-4 mg Oral PRN Charm Rings, NP      . magnesium hydroxide (MILK OF MAGNESIA) suspension 30 mL  30 mL Oral Daily PRN Charm Rings, NP      . methocarbamol (ROBAXIN) tablet 500 mg  500 mg Oral Q8H  PRN Charm Rings, NP   500 mg at 05/01/19 0153  . multivitamin with minerals tablet 1 tablet  1 tablet Oral Daily Maciah Schweigert, Jackquline Denmark, MD   1 tablet at 05/01/19 0756    Lab Results: No results found for this or any previous visit (from the past 48 hour(s)).  Blood Alcohol level:  Lab Results  Component Value Date   ETH <10 04/28/2019   ETH <10 04/25/2019    Metabolic Disorder Labs: No results found for: HGBA1C, MPG No results found for: PROLACTIN No results found for: CHOL, TRIG, HDL, CHOLHDL, VLDL, LDLCALC  Physical Findings: AIMS: Facial and Oral Movements Muscles of Facial Expression: None, normal Lips and Perioral Area: None, normal Jaw: None, normal Tongue: None, normal,Extremity Movements Upper (arms, wrists, hands, fingers): None, normal Lower (legs, knees, ankles, toes): None, normal, Trunk Movements Neck, shoulders, hips: None, normal, Overall Severity Severity of abnormal movements (highest score from questions above): None, normal Incapacitation due to abnormal movements: None, normal Patient's awareness of abnormal movements (rate only patient's report): No Awareness, Dental Status Current problems with teeth and/or dentures?: No Does patient usually wear dentures?: No  CIWA:  CIWA-Ar Total: 2 COWS:  COWS Total Score: 2  Musculoskeletal: Strength & Muscle Tone: decreased Gait & Station: normal Patient leans: N/A  Psychiatric Specialty Exam: Physical Exam  Nursing note and vitals reviewed. Constitutional: She appears well-developed and well-nourished.  HENT:  Head: Normocephalic and atraumatic.  Eyes: Pupils are equal, round, and reactive to light. Conjunctivae are normal.  Neck: Normal range of motion.  Cardiovascular: Regular rhythm and normal heart sounds.  Respiratory: Effort normal.  GI: Soft.  Musculoskeletal: Normal range of motion.  Neurological: She is alert.  Skin: Skin is warm and dry.  Psychiatric: Judgment normal. Her affect is blunt. Her  speech is delayed. She is slowed. Thought content is not paranoid. Cognition and memory are normal. She expresses no homicidal and no suicidal ideation.    Review of Systems  Constitutional: Positive for malaise/fatigue.  HENT: Negative.   Eyes: Negative.   Respiratory: Negative.   Cardiovascular: Negative.   Gastrointestinal: Positive for abdominal pain and nausea.  Musculoskeletal: Negative.   Skin: Negative.   Neurological: Negative.   Psychiatric/Behavioral: Negative.     Blood pressure 95/71, pulse 79, temperature 98.1 F (36.7 C), temperature source Oral, resp. rate 17, height 5\' 4"  (1.626 m), weight 49 kg, SpO2 100 %.Body mass index is 18.54 kg/m.  General Appearance: Disheveled  Eye Contact:  Minimal  Speech:  Slow  Volume:  Decreased  Mood:  Dysphoric  Affect:  Congruent  Thought Process:  Coherent  Orientation:  Full (Time, Place, and Person)  Thought Content:  Logical  Suicidal Thoughts:  No  Homicidal Thoughts:  No  Memory:  Immediate;   Fair Recent;   Fair Remote;   Fair  Judgement:  Fair  Insight:  Fair  Psychomotor Activity:  Decreased  Concentration:  Concentration: Fair  Recall:  [employer] of Knowledge:  Fair  Language:  Fair  Akathisia:  No  Handed:  Right  AIMS (if indicated):     Assets:  Desire for Improvement  ADL's:  Impaired  Cognition:  Impaired,  Mild  Sleep:  Number of Hours: 7.5     Treatment Plan Summary: Daily contact with patient to assess and evaluate symptoms and progress in treatment, Medication management and Plan I have extended her Suboxone by giving her a dose this evening as she is still having significant symptoms.  Lower doses twice tomorrow and then we will be at the end of the allowable use of the medicine.  Treatment team can discuss the possibility of referral to inpatient substance abuse treatment.  Encourage group attendance and encourage her to make sure she is eating and drinking normally.  Alethia Berthold,  MD 05/01/2019, 3:46 PM

## 2019-05-02 MED ORDER — ONDANSETRON 4 MG PO TBDP: 8.0000 mg | ORAL_TABLET | Freq: Three times a day (TID) | ORAL | Status: DC | PRN

## 2019-05-02 MED ORDER — METHOCARBAMOL 750 MG PO TABS
750.0000 mg | ORAL_TABLET | Freq: Four times a day (QID) | ORAL | Status: DC | PRN
  Administered 2019-05-03 – 2019-05-04 (×3): 750 mg via ORAL
  Filled 2019-05-02 (×4): qty 1

## 2019-05-02 MED ORDER — CLONIDINE HCL 0.1 MG PO TABS
0.1000 mg | ORAL_TABLET | Freq: Four times a day (QID) | ORAL | Status: DC | PRN
  Administered 2019-05-05: 0.1 mg via ORAL
  Filled 2019-05-02: qty 1

## 2019-05-02 NOTE — Progress Notes (Signed)
CSW spoke with Fritz Pickerel from [facility] who reported that pt can come in Monday at [facility] informed attending physician.  Evalina Field, MSW, LCSW Clinical Social Work 05/02/2019 1:27 PM

## 2019-05-02 NOTE — [facility] Counselor (Signed)
CSW spoke with patient to inform the patient that she has been accepted to [facility] and there will be a bed available on Monday 05/06/2019.  Patient was receptive.  Assunta Curtis, MSW, LCSW 05/02/2019 1:36 PM

## 2019-05-02 NOTE — Progress Notes (Signed)
Recreation Therapy Notes   Date: 05/02/2019  Time: 2:00 pm  Location: Outside  Behavioral response: Appropriate  Group Type: Leisure  Participation level: Active  Communication: Patient was social with peers and staff.  Comments: N/A  Gerod Caligiuri LRT/CTRS        Kenadi Miltner 05/02/2019 4:11 PM

## 2019-05-02 NOTE — Progress Notes (Signed)
Patient in bed awake. Alert and oriented. Calm and cooperative. Thought process organized. Speech is clear and concise. Denying thoughts of self harm. Denying hallucinations. Wants to relax in bed for now. Staff continue to monitor for safety.

## 2019-05-02 NOTE — Plan of Care (Signed)
Came to the dayroom at snack time. Requested medication to help with sleep: received Vistaril 25 mg by mouth. Expressed concerns related to homelessness and substance abuse problem. Patient is otherwise cooperative and denying suicidal thoughts.

## 2019-05-02 NOTE — Progress Notes (Signed)
[facility] MD Progress Note  05/02/2019 9:52 AM Christina Cohen  MRN:  [id number] Subjective: Patient seen and chart reviewed.  Patient with opiate dependence and depressed mood.  Patient told me at first this morning that she wanted to be discharged today and would stay with a "friend".  I spoke with her for a while and told her I was aware of how unpleasant opiate withdrawal could be and that the 3 days of Suboxone is not enough to get completely through it.  Also emphasized to her the very high risk of relapse into narcotic abuse if she were to leave the hospital at this point and the accompanying risk of death either from overdose or medical complications of IV drug abuse.  I encourage the patient to consider staying in the hospital while we refer her to the alcohol and drug abuse treatment center.  Patient's mood is dysphoric but not suicidal.  Thoughts are lucid.  She is oriented and interacting appropriately. Principal Problem: Major depressive disorder, recurrent severe without psychotic features ([place]) Diagnosis: Principal Problem:   Major depressive disorder, recurrent severe without psychotic features ([place]) Active Problems:   Opiate abuse, continuous ([place])   Suicidal ideation  Total Time spent with patient: 30 minutes  Past Psychiatric History: Patient has a long history of narcotic abuse.  Has had some treatment in the past.  Things have gotten pretty bad recently has had 2 near fatal overdoses just in the last week or so.  Past Medical History: History reviewed. No pertinent past medical history.  Past Surgical History:  Procedure Laterality Date  . CESAREAN SECTION     Family History:  Family History  Problem Relation Age of Onset  . Diabetes Maternal Grandmother   . Thyroid disease Maternal Grandmother    Family Psychiatric  History: Denies any Social History:  Social History   Substance and Sexual Activity  Alcohol Use No  . Alcohol/week: 0.0 standard drinks     Social History    Substance and Sexual Activity  Drug Use Yes    Social History   Socioeconomic History  . Marital status: Legally Separated    Spouse name: Not on file  . Number of children: Not on file  . Years of education: Not on file  . Highest education level: Not on file  Occupational History  . Not on file  Social Needs  . Financial resource strain: Not on file  . Food insecurity    Worry: Not on file    Inability: Not on file  . Transportation needs    Medical: Not on file    Non-medical: Not on file  Tobacco Use  . Smoking status: Current Every Day Smoker    Packs/day: 1.00    Years: 10.00    Pack years: 10.00    Types: Cigarettes  . Smokeless tobacco: Never Used  Substance and Sexual Activity  . Alcohol use: No    Alcohol/week: 0.0 standard drinks  . Drug use: Yes  . Sexual activity: Not on file  Lifestyle  . Physical activity    Days per week: Not on file    Minutes per session: Not on file  . Stress: Not on file  Relationships  . Social Herbalist on phone: Not on file    Gets together: Not on file    Attends religious service: Not on file    Active member of club or organization: Not on file    Attends meetings of clubs  or organizations: Not on file    Relationship status: Not on file  Other Topics Concern  . Not on file  Social History Narrative  . Not on file   Additional Social History:                         Sleep: Fair  Appetite:  Fair  Current Medications: Current Facility-Administered Medications  Medication Dose Route Frequency Provider Last Rate Last Dose  . acetaminophen (TYLENOL) tablet 650 mg  650 mg Oral Q6H PRN Charm RingsLord, Jamison Y, NP      . alum & mag hydroxide-simeth (MAALOX/MYLANTA) 200-200-20 MG/5ML suspension 30 mL  30 mL Oral Q4H PRN Charm RingsLord, Jamison Y, NP      . buprenorphine-naloxone (SUBOXONE) 2-0.5 mg per SL tablet 2 tablet  2 tablet Sublingual BID Zamira Hickam, Jackquline DenmarkJohn T, MD   2 tablet at 05/02/19 0741  . cloNIDine  (CATAPRES) tablet 0.1 mg  0.1 mg Oral Q6H PRN Chabely Norby T, MD      . feeding supplement (ENSURE ENLIVE) (ENSURE ENLIVE) liquid 237 mL  237 mL Oral BID BM Jeren Dufrane T, MD   237 mL at 05/01/19 1346  . hydrOXYzine (ATARAX/VISTARIL) tablet 25 mg  25 mg Oral Q6H PRN Charm RingsLord, Jamison Y, NP   25 mg at 05/01/19 2038  . loperamide (IMODIUM) capsule 2-4 mg  2-4 mg Oral PRN Charm RingsLord, Jamison Y, NP      . magnesium hydroxide (MILK OF MAGNESIA) suspension 30 mL  30 mL Oral Daily PRN Charm RingsLord, Jamison Y, NP      . methocarbamol (ROBAXIN) tablet 750 mg  750 mg Oral Q6H PRN Liz Pinho, Jackquline DenmarkJohn T, MD      . multivitamin with minerals tablet 1 tablet  1 tablet Oral Daily Purvis Sidle, Jackquline DenmarkJohn T, MD   1 tablet at 05/02/19 0741  . ondansetron (ZOFRAN-ODT) disintegrating tablet 8 mg  8 mg Oral Q8H PRN Alixandra Alfieri, Jackquline DenmarkJohn T, MD        Lab Results: No results found for this or any previous visit (from the past 48 hour(s)).  Blood Alcohol level:  Lab Results  Component Value Date   ETH <10 04/28/2019   ETH <10 04/25/2019    Metabolic Disorder Labs: No results found for: HGBA1C, MPG No results found for: PROLACTIN No results found for: CHOL, TRIG, HDL, CHOLHDL, VLDL, LDLCALC  Physical Findings: AIMS: Facial and Oral Movements Muscles of Facial Expression: None, normal Lips and Perioral Area: None, normal Jaw: None, normal Tongue: None, normal,Extremity Movements Upper (arms, wrists, hands, fingers): None, normal Lower (legs, knees, ankles, toes): None, normal, Trunk Movements Neck, shoulders, hips: None, normal, Overall Severity Severity of abnormal movements (highest score from questions above): None, normal Incapacitation due to abnormal movements: None, normal Patient's awareness of abnormal movements (rate only patient's report): No Awareness, Dental Status Current problems with teeth and/or dentures?: No Does patient usually wear dentures?: No  CIWA:  CIWA-Ar Total: 2 COWS:  COWS Total Score:  0  Musculoskeletal: Strength & Muscle Tone: within normal limits Gait & Station: normal Patient leans: N/A  Psychiatric Specialty Exam: Physical Exam  Nursing note and vitals reviewed. Constitutional: She appears well-developed and well-nourished.  HENT:  Head: Normocephalic and atraumatic.  Eyes: Pupils are equal, round, and reactive to light. Conjunctivae are normal.  Neck: Normal range of motion.  Cardiovascular: Regular rhythm and normal heart sounds.  Respiratory: Effort normal.  GI: Soft.  Musculoskeletal: Normal range of motion.  Neurological: She is  alert.  Skin: Skin is warm and dry.  Psychiatric: Her affect is blunt. Her speech is delayed. She is slowed. Thought content is not paranoid. Cognition and memory are impaired. She expresses impulsivity. She expresses no homicidal and no suicidal ideation.    Review of Systems  Constitutional: Positive for malaise/fatigue.  HENT: Negative.   Eyes: Negative.   Respiratory: Negative.   Cardiovascular: Negative.   Gastrointestinal: Positive for nausea.  Musculoskeletal: Positive for myalgias.  Skin: Negative.   Neurological: Negative.   Psychiatric/Behavioral: Positive for substance abuse. Negative for depression, hallucinations, memory loss and suicidal ideas. The patient is not nervous/anxious and does not have insomnia.     Blood pressure 94/65, pulse (!) 57, temperature 97.8 F (36.6 C), temperature source Oral, resp. rate 16, height 5\' 4"  (1.626 m), weight 49 kg, SpO2 100 %.Body mass index is 18.54 kg/m.  General Appearance: Casual  Eye Contact:  Fair  Speech:  Normal Rate  Volume:  Normal  Mood:  Dysphoric  Affect:  Congruent  Thought Process:  Coherent  Orientation:  Full (Time, Place, and Person)  Thought Content:  Logical  Suicidal Thoughts:  No  Homicidal Thoughts:  No  Memory:  Immediate;   Fair Recent;   Fair Remote;   Fair  Judgement:  Fair  Insight:  Fair  Psychomotor Activity:  Normal   Concentration:  Concentration: Fair  Recall:  [employer] of Knowledge:  Fair  Language:  Fair  Akathisia:  No  Handed:  Right  AIMS (if indicated):     Assets:  Desire for Improvement Physical Health  ADL's:  Intact  Cognition:  WNL  Sleep:  Number of Hours: 8.15     Treatment Plan Summary: Daily contact with patient to assess and evaluate symptoms and progress in treatment, Medication management and Plan As I told the patient, I think she is capable of making decisions for herself but I also emphasized to her that the desire to physically feel better is a major temptation that could keep her addiction going.  After our conversation she voluntarily said that she would agree to stay a little bit longer while we tried to find a referral to the alcohol and drug abuse treatment center.  They are likely to accept her but I am not sure when a bed will be available.  3 days is up at the end of the day today but I promised that I will provide other medication besides narcotics to help with withdrawal symptoms including Robaxin, clonidine, Zofran, Imodium and what ever else is reasonable.  No other change to medication at this time.  Encourage patient to keep eating and drinking well.  Encourage continued participation in groups.  Case reviewed with treatment team.  Mordecai Rasmussen, MD 05/02/2019, 9:52 AM

## 2019-05-02 NOTE — Progress Notes (Signed)
Recreation Therapy Notes   Date: 05/02/2019  Time: 9:30 am  Location: Craft room  Behavioral response: Appropriate   Intervention Topic: Problem-Solving   Discussion/Intervention:  Group content on today was focused on problem solving. The group described what problem solving is. Patients expressed how problems affect them and how they deal with problems. Individuals identified healthy ways to deal with problems. Patients explained what normally happens to them when they do not deal with problems. The group expressed reoccurring problems for them. The group participated in the intervention "Ways to Solve problems" where patients were given a chance to explore different ways to solve problems.  Clinical Observations/Feedback:  Patient came to group and defined problem solving as solving problems. She stated most times when she has problems she does not do anything normally. Participant explained that stress can form if problems go unsolved.   Individual was social with peers and staff while participating in group.  Christina Cohen LRT/CTRS           Christina Cohen 05/02/2019 11:23 AM

## 2019-05-02 NOTE — Plan of Care (Signed)
Patient stayed in bed most of the shift.Verbalized minimal withdrawal symptoms.Appropriate with staff & peers.Denies SI,HI and AVH.Appetite good.Patient is looking forward to go to the rehab program.Support and encouragement given.

## 2019-05-02 NOTE — [facility] Group Notes (Signed)
Balance In Life 05/02/2019 1PM  Type of Therapy/Topic:  Group Therapy:  Balance in Life  Participation Level:  Did Not Attend  Description of Group:   This group will address the concept of balance and how it feels and looks when one is unbalanced. Patients will be encouraged to process areas in their lives that are out of balance and identify reasons for remaining unbalanced. Facilitators will guide patients in utilizing problem-solving interventions to address and correct the stressor making their life unbalanced. Understanding and applying boundaries will be explored and addressed for obtaining and maintaining a balanced life. Patients will be encouraged to explore ways to assertively make their unbalanced needs known to significant others in their lives, using other group members and facilitator for support and feedback.  Therapeutic Goals: 1. Patient will identify two or more emotions or situations they have that consume much of in their lives. 2. Patient will identify signs/triggers that life has become out of balance:  3. Patient will identify two ways to set boundaries in order to achieve balance in their lives:  4. Patient will demonstrate ability to communicate their needs through discussion and/or role plays  Summary of Patient Progress:    Therapeutic Modalities:   Cognitive Behavioral Therapy Solution-Focused Therapy Assertiveness Training  Kazuo Durnil T Posey Jasmin, LCSW  

## 2019-05-03 MED ORDER — TRAZODONE HCL 100 MG PO TABS
100.0000 mg | ORAL_TABLET | Freq: Every day | ORAL | Status: DC
  Administered 2019-05-03 – 2019-05-05 (×3): 100 mg via ORAL
  Filled 2019-05-03 (×3): qty 1

## 2019-05-03 NOTE — Plan of Care (Signed)
Pt rates anxiety and depression 5/10. Pt denies SI, HI and AVH. Pt was educated on care plan and verbalizes understanding.  Problem: Education: Goal: Ability to make informed decisions regarding treatment will improve Outcome: Progressing   Problem: Coping: Goal: Coping ability will improve Outcome: Progressing   Problem: Health Behavior/Discharge Planning: Goal: Identification of resources available to assist in meeting health care needs will improve Outcome: Progressing   Problem: Medication: Goal: Compliance with prescribed medication regimen will improve Outcome: Progressing   Problem: Self-Concept: Goal: Ability to disclose and discuss suicidal ideas will improve Outcome: Progressing Goal: Will verbalize positive feelings about self Outcome: Progressing   Problem: Education: Goal: Utilization of techniques to improve thought processes will improve Outcome: Progressing Goal: Knowledge of the prescribed therapeutic regimen will improve Outcome: Progressing   Problem: Activity: Goal: Interest or engagement in leisure activities will improve Outcome: Progressing Goal: Imbalance in normal sleep/wake cycle will improve Outcome: Progressing   Problem: Coping: Goal: Coping ability will improve Outcome: Progressing Goal: Will verbalize feelings Outcome: Progressing   Problem: Health Behavior/Discharge Planning: Goal: Ability to make decisions will improve Outcome: Progressing Goal: Compliance with therapeutic regimen will improve Outcome: Progressing   Problem: Role Relationship: Goal: Will demonstrate positive changes in social behaviors and relationships Outcome: Progressing   Problem: Safety: Goal: Ability to disclose and discuss suicidal ideas will improve Outcome: Progressing Goal: Ability to identify and utilize support systems that promote safety will improve Outcome: Progressing   Problem: Self-Concept: Goal: Will verbalize positive feelings about  self Outcome: Progressing Goal: Level of anxiety will decrease Outcome: Progressing   Problem: Education: Goal: Ability to state activities that reduce stress will improve Outcome: Progressing   Problem: Coping: Goal: Ability to identify and develop effective coping behavior will improve Outcome: Progressing   Problem: Self-Concept: Goal: Ability to identify factors that promote anxiety will improve Outcome: Progressing Goal: Level of anxiety will decrease Outcome: Progressing Goal: Ability to modify response to factors that promote anxiety will improve Outcome: Progressing

## 2019-05-03 NOTE — [facility] Group Notes (Signed)
LCSW Group Therapy Note  05/03/2019 9:58 AM  Type of Therapy and Topic:  Group Therapy:  Feelings around Relapse and Recovery  Participation Level:  Active   Description of Group:    Patients in this group will discuss emotions they experience before and after a relapse. They will process how experiencing these feelings, or avoidance of experiencing them, relates to having a relapse. Facilitator will guide patients to explore emotions they have related to recovery. Patients will be encouraged to process which emotions are more powerful. They will be guided to discuss the emotional reaction significant others in their lives may have to their relapse or recovery. Patients will be assisted in exploring ways to respond to the emotions of others without this contributing to a relapse.  Therapeutic Goals: 1. Patient will identify two or more emotions that lead to a relapse for them 2. Patient will identify two emotions that result when they relapse 3. Patient will identify two emotions related to recovery 4. Patient will demonstrate ability to communicate their needs through discussion and/or role plays   Summary of Patient Progress: Pt was appropriate and respectful in group. Pt was able to identify relapse as "repeating something you don't want to repeat again". Pt reported that something that will cause her to relapse on her mental health is drugs. Pt reported that she is going to substance abuse treatment Monday and asked questions regarding the facility. Pt identified going to jail or dying as outcomes of relapse and "more life and achieving goals" as outcomes of sobriety.    Therapeutic Modalities:   Cognitive Behavioral Therapy Solution-Focused Therapy Assertiveness Training Relapse Prevention Therapy   Evalina Field, MSW, LCSW Clinical Social Work 05/03/2019 9:58 AM

## 2019-05-03 NOTE — [facility] Counselor (Signed)
CSW provided the patient with a information sheet for [facility] and what she could and could not bring.  CSW informed that he has been accepted and bed will be available Tuesday and transportation has been arranged.   Assunta Curtis, MSW, LCSW 05/03/2019 1:23 PM

## 2019-05-03 NOTE — Progress Notes (Signed)
CSW scheduled [facility] transportation for Monday 05/06/19 at [place], MSW, LCSW Clinical Social Work 05/03/2019 9:23 AM

## 2019-05-03 NOTE — Progress Notes (Signed)
Recreation Therapy Notes  Date: 05/03/2019  Time: 9:30 am  Location: Craft room  Behavioral response: Appropriate   Intervention Topic: Communication    Discussion/Intervention:  Group content today was focused on communication. The group defined communication and ways to communicate with others. Individuals stated reason why communication is important and some reasons to communicate with others. Patients expressed if they thought they were good at communicating with others and ways they could improve their communication skills. The group identified important parts of communication and some experiences they have had in the past with communication. The group participated in the intervention "Words in a Bag", where they had a chance to test out their communication skills and identify ways to improve their communication techniques.  Clinical Observations/Feedback:  Patient came to group and defined communication body language. She expressed that she prefers to communicate by witting. Participant explained that communication is based on how you approach people. She stated that communication is important to let others know how you feel. Individual was social with peers and staff while participating in group.  Christina Cohen LRT/CTRS         Court Gracia 05/03/2019 11:34 AM

## 2019-05-03 NOTE — Progress Notes (Signed)
[facility]  05/03/2019 4:22 PM Christina Cohen  MRN:  [id number] Subjective: Follow-up for patient with heroin dependence and depression.  Patient has no new complaints today.  Mood stable.  Not acutely suicidal.  Interacts well with peers and staff.  Still having cramping muscle spasms aches and pains and generally feeling sick from heroin withdrawal.  Continues however to be agreeable to transfer to inpatient treatment Principal Problem: Major depressive disorder, recurrent severe without psychotic features (HCC) Diagnosis: Principal Problem:   Major depressive disorder, recurrent severe without psychotic features (HCC) Active Problems:   Opiate abuse, continuous (HCC)   Suicidal ideation  Total Time spent with patient: 30 minutes  Past Psychiatric History: History of recurrent overdoses from heroin  Past Medical History: History reviewed. No pertinent past medical history.  Past Surgical History:  Procedure Laterality Date  . CESAREAN SECTION     Family History:  Family History  Problem Relation Age of Onset  . Diabetes Maternal Grandmother   . Thyroid disease Maternal Grandmother    Family Psychiatric  History: See previous Social History:  Social History   Substance and Sexual Activity  Alcohol Use No  . Alcohol/week: 0.0 standard drinks     Social History   Substance and Sexual Activity  Drug Use Yes    Social History   Socioeconomic History  . Marital status: Legally Separated    Spouse name: Not on file  . Number of children: Not on file  . Years of education: Not on file  . Highest education level: Not on file  Occupational History  . Not on file  Social Needs  . Financial resource strain: Not on file  . Food insecurity    Worry: Not on file    Inability: Not on file  . Transportation needs    Medical: Not on file    Non-medical: Not on file  Tobacco Use  . Smoking status: Current Every Day Smoker    Packs/day: 1.00    Years: 10.00    Pack  years: 10.00    Types: Cigarettes  . Smokeless tobacco: Never Used  Substance and Sexual Activity  . Alcohol use: No    Alcohol/week: 0.0 standard drinks  . Drug use: Yes  . Sexual activity: Not on file  Lifestyle  . Physical activity    Days per week: Not on file    Minutes per session: Not on file  . Stress: Not on file  Relationships  . Social Musicianconnections    Talks on phone: Not on file    Gets together: Not on file    Attends religious service: Not on file    Active member of club or organization: Not on file    Attends meetings of clubs or organizations: Not on file    Relationship status: Not on file  Other Topics Concern  . Not on file  Social History Narrative  . Not on file   Additional Social History:                         Sleep: Fair  Appetite:  Fair  Current Medications: Current Facility-Administered Medications  Medication Dose Route Frequency Provider Last Rate Last Dose  . acetaminophen (TYLENOL) tablet 650 mg  650 mg Oral Q6H PRN Charm RingsLord, Jamison Y, NP      . alum & mag hydroxide-simeth (MAALOX/MYLANTA) 200-200-20 MG/5ML suspension 30 mL  30 mL Oral Q4H PRN Charm RingsLord, Jamison Y, NP      .  cloNIDine (CATAPRES) tablet 0.1 mg  0.1 mg Oral Q6H PRN ,  T, MD      . feeding supplement (ENSURE ENLIVE) (ENSURE ENLIVE) liquid 237 mL  237 mL Oral BID BM ,  T, MD   237 mL at 05/03/19 1200  . hydrOXYzine (ATARAX/VISTARIL) tablet 25 mg  25 mg Oral Q6H PRN Patrecia Pour, NP   25 mg at 05/03/19 0754  . loperamide (IMODIUM) capsule 2-4 mg  2-4 mg Oral PRN Patrecia Pour, NP      . magnesium hydroxide (MILK OF MAGNESIA) suspension 30 mL  30 mL Oral Daily PRN Patrecia Pour, NP      . methocarbamol (ROBAXIN) tablet 750 mg  750 mg Oral Q6H PRN , Madie Reno, MD   750 mg at 05/03/19 1224  . multivitamin with minerals tablet 1 tablet  1 tablet Oral Daily , Madie Reno, MD   1 tablet at 05/03/19 0800  . ondansetron (ZOFRAN-ODT) disintegrating  tablet 8 mg  8 mg Oral Q8H PRN , Madie Reno, MD        Lab Results: No results found for this or any previous visit (from the past 48 hour(s)).  Blood Alcohol level:  Lab Results  Component Value Date   ETH <10 04/28/2019   ETH <10 00/93/8182    Metabolic Disorder Labs: No results found for: HGBA1C, MPG No results found for: PROLACTIN No results found for: CHOL, TRIG, HDL, CHOLHDL, VLDL, LDLCALC  Physical Findings: AIMS: Facial and Oral Movements Muscles of Facial Expression: None, normal Lips and Perioral Area: None, normal Jaw: None, normal Tongue: None, normal,Extremity Movements Upper (arms, wrists, hands, fingers): None, normal Lower (legs, knees, ankles, toes): None, normal, Trunk Movements Neck, shoulders, hips: None, normal, Overall Severity Severity of abnormal movements (highest score from questions above): None, normal Incapacitation due to abnormal movements: None, normal Patient's awareness of abnormal movements (rate only patient's report): No Awareness, Dental Status Current problems with teeth and/or dentures?: No Does patient usually wear dentures?: No  CIWA:  CIWA-Ar Total: 2 COWS:  COWS Total Score: 1  Musculoskeletal: Strength & Muscle Tone: within normal limits Gait & Station: normal Patient leans: N/A  Psychiatric Specialty Exam: Physical Exam  Nursing note and vitals reviewed. Constitutional: She appears well-developed and well-nourished.  HENT:  Head: Normocephalic and atraumatic.  Eyes: Pupils are equal, round, and reactive to light. Conjunctivae are normal.  Neck: Normal range of motion.  Cardiovascular: Regular rhythm and normal heart sounds.  Respiratory: Effort normal. No respiratory distress.  GI: Soft.  Musculoskeletal: Normal range of motion.  Neurological: She is alert.  Skin: Skin is warm and dry.  Psychiatric: Judgment normal. Her affect is blunt. Her speech is delayed. She is slowed. Cognition and memory are normal. She  expresses no suicidal ideation.    Review of Systems  Constitutional: Negative.   HENT: Negative.   Eyes: Negative.   Respiratory: Negative.   Cardiovascular: Negative.   Gastrointestinal: Negative.   Musculoskeletal: Negative.   Skin: Negative.   Neurological: Negative.   Psychiatric/Behavioral: Negative.     Blood pressure 103/65, pulse 74, temperature 98.4 F (36.9 C), temperature source Oral, resp. rate 18, height 5\' 4"  (1.626 m), weight 49 kg, SpO2 100 %.Body mass index is 18.54 kg/m.  General Appearance: Casual  Eye Contact:  Fair  Speech:  Slow  Volume:  Decreased  Mood:  Dysphoric  Affect:  Congruent  Thought Process:  Goal Directed  Orientation:  Full (Time, Place, and  Person)  Thought Content:  Logical  Suicidal Thoughts:  No  Homicidal Thoughts:  No  Memory:  Immediate;   Fair Recent;   Fair Remote;   Fair  Judgement:  Fair  Insight:  Fair  Psychomotor Activity:  Decreased  Concentration:  Concentration: Fair  Recall:  [employer] of Knowledge:  Fair  Language:  Fair  Akathisia:  No  Handed:  Right  AIMS (if indicated):     Assets:  Desire for Improvement  ADL's:  Impaired  Cognition:  WNL  Sleep:  Number of Hours: 8.15     Treatment Plan Summary: Daily contact with patient to assess and evaluate symptoms and progress in treatment, Medication management and Plan Patient appears to be willing to stick with it to go to the alcohol and drug abuse treatment center on Monday.  I made it clear to her that I would do what ever I could within reason to make her physically comfortable between now and then.  She is using Robaxin today.  Gets up when she can goes to bed when she has to.  Continue daily assessment.  Mordecai Rasmussen, MD 05/03/2019, 4:22 PM

## 2019-05-04 NOTE — [facility] Group Notes (Signed)
LCSW Group Therapy Note  05/04/2019  1:00 PM   Type of Therapy and Topic:  Group Therapy:  Trust and Honesty   Participation Level:  Active   Description of Group:    In this group patients will be asked to explore the value of being honest.  Patients will be guided to discuss their thoughts, feelings, and behaviors related to honesty and trusting in others. Patients will process together how trust and honesty relate to forming relationships with peers, family members, and self. Each patient will be challenged to identify and express feelings of being vulnerable. Patients will discuss reasons why people are dishonest and identify alternative outcomes if one was truthful (to self or others). This group will be process-oriented, with patients participating in exploration of their own experiences, giving and receiving support, and processing challenge from other group members.   Therapeutic Goals: 1. Patient will identify why honesty is important to relationships and how honesty overall affects relationships.  2. Patient will identify a situation where they lied or were lied too and the  feelings, thought process, and behaviors surrounding the situation 3. Patient will identify the meaning of being vulnerable, how that feels, and how that correlates to being honest with self and others. 4. Patient will identify situations where they could have told the truth, but instead lied and explain reasons of dishonesty.   Summary of Patient Progress:  Patient was present and active in group. Patient shared that trust and honesty is important in all relationships because "it leads to second guessing in other relationships". Patient shared that once someone is honest and trustworthy it makes it more likely that the other person involved will be trustworthy as well and it allows for a "special connection" to be developed.     Therapeutic Modalities:   Cognitive Behavioral Therapy Solution Focused  Therapy Motivational Interviewing Brief Therapy  Assunta Curtis, MSW, LCSW 05/04/2019 2:17 PM

## 2019-05-04 NOTE — Progress Notes (Signed)
Patient alert and oriented x 4, affect is brighter, she appears less anxious, she denies withdrawal symptoms, she was calm and cooperative with staff, she stated " l feel better"  Patient is receptive to staff, she rated depression a 3/10 on a scale (0 low - 10 high ) she denies SI/HI/AVH,  compliant with medication regimen,  15 minutes safety checks maintained will continue to monitor.

## 2019-05-04 NOTE — Plan of Care (Signed)
Patient able to articulate techniques to improve thought process.

## 2019-05-04 NOTE — Plan of Care (Signed)
  Problem: Education: Goal: Ability to make informed decisions regarding treatment will improve Outcome: Progressing  Patient able to make informed decisions about  her treatment plan.

## 2019-05-04 NOTE — Progress Notes (Signed)
[facility] MD Progress Note  05/04/2019 1:18 PM Christina Cohen  MRN:  [id number] Subjective: No new complaints.  Sleeping okay at night.  Mostly feeling all right during the day but still rundown at times.  Still some aches and pains.  Nevertheless she is sticking with it to agree to go to inpatient treatment.  No current suicidal ideation.  No evident psychosis.  Interacting well with others on the unit. Principal Problem: Major depressive disorder, recurrent severe without psychotic features (HCC) Diagnosis: Principal Problem:   Major depressive disorder, recurrent severe without psychotic features (HCC) Active Problems:   Opiate abuse, continuous (HCC)   Suicidal ideation  Total Time spent with patient: 30 minutes  Past Psychiatric History: Past history of recurrent episodes of depression but mostly major substance abuse issues  Past Medical History: History reviewed. No pertinent past medical history.  Past Surgical History:  Procedure Laterality Date  . CESAREAN SECTION     Family History:  Family History  Problem Relation Age of Onset  . Diabetes Maternal Grandmother   . Thyroid disease Maternal Grandmother    Family Psychiatric  History: See previous Social History:  Social History   Substance and Sexual Activity  Alcohol Use No  . Alcohol/week: 0.0 standard drinks     Social History   Substance and Sexual Activity  Drug Use Yes    Social History   Socioeconomic History  . Marital status: Legally Separated    Spouse name: Not on file  . Number of children: Not on file  . Years of education: Not on file  . Highest education level: Not on file  Occupational History  . Not on file  Social Needs  . Financial resource strain: Not on file  . Food insecurity    Worry: Not on file    Inability: Not on file  . Transportation needs    Medical: Not on file    Non-medical: Not on file  Tobacco Use  . Smoking status: Current Every Day Smoker    Packs/day: 1.00    Years:  10.00    Pack years: 10.00    Types: Cigarettes  . Smokeless tobacco: Never Used  Substance and Sexual Activity  . Alcohol use: No    Alcohol/week: 0.0 standard drinks  . Drug use: Yes  . Sexual activity: Not on file  Lifestyle  . Physical activity    Days per week: Not on file    Minutes per session: Not on file  . Stress: Not on file  Relationships  . Social Musician on phone: Not on file    Gets together: Not on file    Attends religious service: Not on file    Active member of club or organization: Not on file    Attends meetings of clubs or organizations: Not on file    Relationship status: Not on file  Other Topics Concern  . Not on file  Social History Narrative  . Not on file   Additional Social History:                         Sleep: Fair  Appetite:  Fair  Current Medications: Current Facility-Administered Medications  Medication Dose Route Frequency Provider Last Rate Last Dose  . acetaminophen (TYLENOL) tablet 650 mg  650 mg Oral Q6H PRN Charm Rings, NP      . alum & mag hydroxide-simeth (MAALOX/MYLANTA) 200-200-20 MG/5ML suspension 30 mL  30  mL Oral Q4H PRN Patrecia Pour, NP      . cloNIDine (CATAPRES) tablet 0.1 mg  0.1 mg Oral Q6H PRN Marylee Belzer T, MD      . feeding supplement (ENSURE ENLIVE) (ENSURE ENLIVE) liquid 237 mL  237 mL Oral BID BM Garek Schuneman T, MD   237 mL at 05/04/19 1037  . hydrOXYzine (ATARAX/VISTARIL) tablet 25 mg  25 mg Oral Q6H PRN Patrecia Pour, NP   25 mg at 05/04/19 0834  . loperamide (IMODIUM) capsule 2-4 mg  2-4 mg Oral PRN Patrecia Pour, NP      . magnesium hydroxide (MILK OF MAGNESIA) suspension 30 mL  30 mL Oral Daily PRN Patrecia Pour, NP      . methocarbamol (ROBAXIN) tablet 750 mg  750 mg Oral Q6H PRN Eara Burruel, Madie Reno, MD   750 mg at 05/03/19 2109  . multivitamin with minerals tablet 1 tablet  1 tablet Oral Daily Irie Dowson, Madie Reno, MD   1 tablet at 05/04/19 [id number]  . ondansetron (ZOFRAN-ODT)  disintegrating tablet 8 mg  8 mg Oral Q8H PRN Reta Norgren T, MD      . traZODone (DESYREL) tablet 100 mg  100 mg Oral QHS Aleyssa Pike, Madie Reno, MD   100 mg at 05/03/19 2106    Lab Results: No results found for this or any previous visit (from the past 48 hour(s)).  Blood Alcohol level:  Lab Results  Component Value Date   ETH <10 04/28/2019   ETH <10 37/16/9678    Metabolic Disorder Labs: No results found for: HGBA1C, MPG No results found for: PROLACTIN No results found for: CHOL, TRIG, HDL, CHOLHDL, VLDL, LDLCALC  Physical Findings: AIMS: Facial and Oral Movements Muscles of Facial Expression: None, normal Lips and Perioral Area: None, normal Jaw: None, normal Tongue: None, normal,Extremity Movements Upper (arms, wrists, hands, fingers): None, normal Lower (legs, knees, ankles, toes): None, normal, Trunk Movements Neck, shoulders, hips: None, normal, Overall Severity Severity of abnormal movements (highest score from questions above): None, normal Incapacitation due to abnormal movements: None, normal Patient's awareness of abnormal movements (rate only patient's report): No Awareness, Dental Status Current problems with teeth and/or dentures?: No Does patient usually wear dentures?: No  CIWA:  CIWA-Ar Total: 2 COWS:  COWS Total Score: 1  Musculoskeletal: Strength & Muscle Tone: within normal limits Gait & Station: normal Patient leans: N/A  Psychiatric Specialty Exam: Physical Exam  Nursing note and vitals reviewed. Constitutional: She appears well-developed and well-nourished.  HENT:  Head: Normocephalic and atraumatic.  Eyes: Pupils are equal, round, and reactive to light. Conjunctivae are normal.  Neck: Normal range of motion.  Cardiovascular: Regular rhythm and normal heart sounds.  Respiratory: Effort normal.  GI: Soft.  Musculoskeletal: Normal range of motion.  Neurological: She is alert.  Skin: Skin is warm and dry.  Psychiatric: Judgment normal. Her affect  is blunt. Her speech is delayed. She is slowed and withdrawn. Cognition and memory are normal. She expresses no homicidal and no suicidal ideation.    Review of Systems  Constitutional: Negative.   HENT: Negative.   Eyes: Negative.   Respiratory: Negative.   Cardiovascular: Negative.   Gastrointestinal: Negative.   Musculoskeletal: Negative.   Skin: Negative.   Neurological: Negative.   Psychiatric/Behavioral: Negative.     Blood pressure 102/69, pulse 80, temperature 97.7 F (36.5 C), temperature source Oral, resp. rate 18, height 5\' 4"  (1.626 m), weight 49 kg, SpO2 100 %.Body mass index is 18.54  kg/m.  General Appearance: Casual  Eye Contact:  Fair  Speech:  Clear and Coherent  Volume:  Normal  Mood:  Dysphoric  Affect:  Congruent  Thought Process:  Goal Directed  Orientation:  Full (Time, Place, and Person)  Thought Content:  Logical  Suicidal Thoughts:  No  Homicidal Thoughts:  No  Memory:  Immediate;   Fair Recent;   Fair Remote;   Fair  Judgement:  Fair  Insight:  Fair  Psychomotor Activity:  Normal  Concentration:  Concentration: Fair  Recall:  [employer]  Fund of Knowledge:  Fair  Language:  Fair  Akathisia:  No  Handed:  Right  AIMS (if indicated):     Assets:  Desire for Improvement  ADL's:  Intact  Cognition:  WNL  Sleep:  Number of Hours: 7.75     Treatment Plan Summary: Daily contact with patient to assess and evaluate symptoms and progress in treatment, Medication management and Plan Continue daily check-in's and supportive therapy.  Monitoring vitals.  Monitoring group attendance.  Plan is for [facility]  Mordecai RasmussenJohn Manika Hast, MD 05/04/2019, 1:18 PM

## 2019-05-04 NOTE — Plan of Care (Signed)
D: Patient denies SI/HI/AVH. Patient verbally contracts for safety. Patient is calm, cooperative and pleasant. Patient is seen in milieu interacting with peers. Patient has no complaints at this time.  A: Patient was assessed by this nurse. Patient was oriented to unit. Patient's safety was maintained on unit. Q x 15 minute observation checks were completed for safety. Patient care plan was reviewed. Patient was offered support and encouragement. Patient was encourage to attend groups, participate in unit activities and continue with plan of care.    R: Patient has no complaints of pain at this time. Patient is receptive to treatment and safety maintained on unit.     Problem: Education: Goal: Ability to make informed decisions regarding treatment will improve Outcome: Not Progressing   Problem: Coping: Goal: Coping ability will improve Outcome: Not Progressing   Problem: Health Behavior/Discharge Planning: Goal: Identification of resources available to assist in meeting health care needs will improve Outcome: Not Progressing

## 2019-05-04 NOTE — [facility] Counselor (Signed)
Patient wanted to talk after group about transportation to [place].  CSW informed that [employer] would be providing transportation.   Patient wanted to know id she could smoke on [facility] premises and CSW informed her that she did not think so.  Assunta Curtis, MSW, LCSW 05/04/2019 2:17 PM

## 2019-05-05 MED ORDER — NICOTINE 21 MG/24HR TD PT24: 21.0000 mg | MEDICATED_PATCH | Freq: Every day | TRANSDERMAL | 0 refills | Status: AC

## 2019-05-05 MED ORDER — NICOTINE 21 MG/24HR TD PT24
21.0000 mg | MEDICATED_PATCH | Freq: Every day | TRANSDERMAL | Status: DC
  Administered 2019-05-05: 21 mg via TRANSDERMAL

## 2019-05-05 MED ORDER — HYDROXYZINE HCL 50 MG PO TABS
50.0000 mg | ORAL_TABLET | Freq: Four times a day (QID) | ORAL | Status: DC | PRN
  Administered 2019-05-05: 50 mg via ORAL
  Filled 2019-05-05: qty 1

## 2019-05-05 MED ORDER — TRAZODONE HCL 100 MG PO TABS: 100.0000 mg | ORAL_TABLET | Freq: Every day | ORAL | 0 refills | Status: AC

## 2019-05-05 MED ORDER — ONDANSETRON 8 MG PO TBDP: 8.0000 mg | ORAL_TABLET | Freq: Three times a day (TID) | ORAL | 0 refills | Status: AC | PRN

## 2019-05-05 MED ORDER — METHOCARBAMOL 750 MG PO TABS: 750.0000 mg | ORAL_TABLET | Freq: Four times a day (QID) | ORAL | 0 refills | Status: AC | PRN

## 2019-05-05 NOTE — Tx Team (Signed)
Interdisciplinary Treatment and Diagnostic Plan Update  05/05/2019 Time of Session: [place] MRN: [id number]  Principal Diagnosis: Major depressive disorder, recurrent severe without psychotic features ([place])  Secondary Diagnoses: Principal Problem:   Major depressive disorder, recurrent severe without psychotic features ([place]) Active Problems:   Opiate abuse, continuous ([place])   Suicidal ideation   Current Medications:  Current Facility-Administered Medications  Medication Dose Route Frequency Provider Last Rate Last Dose  . acetaminophen (TYLENOL) tablet 650 mg  650 mg Oral Q6H PRN Patrecia Pour, NP   650 mg at 05/04/19 1346  . alum & mag hydroxide-simeth (MAALOX/MYLANTA) 200-200-20 MG/5ML suspension 30 mL  30 mL Oral Q4H PRN Patrecia Pour, NP      . cloNIDine (CATAPRES) tablet 0.1 mg  0.1 mg Oral Q6H PRN Clapacs, John T, MD   0.1 mg at 05/05/19 0900  . feeding supplement (ENSURE ENLIVE) (ENSURE ENLIVE) liquid 237 mL  237 mL Oral BID BM Clapacs, John T, MD   237 mL at 05/04/19 1350  . magnesium hydroxide (MILK OF MAGNESIA) suspension 30 mL  30 mL Oral Daily PRN Patrecia Pour, NP      . methocarbamol (ROBAXIN) tablet 750 mg  750 mg Oral Q6H PRN Clapacs, Madie Reno, MD   750 mg at 05/04/19 2125  . multivitamin with minerals tablet 1 tablet  1 tablet Oral Daily Clapacs, Madie Reno, MD   1 tablet at 05/05/19 0900  . ondansetron (ZOFRAN-ODT) disintegrating tablet 8 mg  8 mg Oral Q8H PRN Clapacs, John T, MD      . traZODone (DESYREL) tablet 100 mg  100 mg Oral QHS Clapacs, Madie Reno, MD   100 mg at 05/04/19 2125   PTA Medications: No medications prior to admission.    Patient Stressors: Network engineer difficulties Marital or family conflict Occupational concerns  Patient Strengths: Ability for insight Active sense of humor Communication skills Motivation for treatment/growth Physical Health  Treatment Modalities: Medication Management, Group therapy, Case  management,  1 to 1 session with clinician, Psychoeducation, Recreational therapy.   Physician Treatment Plan for Primary Diagnosis: Major depressive disorder, recurrent severe without psychotic features ([place]) Long Term Goal(s): Improvement in symptoms so as ready for discharge Improvement in symptoms so as ready for discharge   Short Term Goals: Ability to verbalize feelings will improve Ability to demonstrate self-control will improve Ability to identify triggers associated with substance abuse/mental health issues will improve  Medication Management: Evaluate patient's response, side effects, and tolerance of medication regimen.  Therapeutic Interventions: 1 to 1 sessions, Unit Group sessions and Medication administration.  Evaluation of Outcomes: Progressing  Physician Treatment Plan for Secondary Diagnosis: Principal Problem:   Major depressive disorder, recurrent severe without psychotic features ([place]) Active Problems:   Opiate abuse, continuous ([place])   Suicidal ideation  Long Term Goal(s): Improvement in symptoms so as ready for discharge Improvement in symptoms so as ready for discharge   Short Term Goals: Ability to verbalize feelings will improve Ability to demonstrate self-control will improve Ability to identify triggers associated with substance abuse/mental health issues will improve     Medication Management: Evaluate patient's response, side effects, and tolerance of medication regimen.  Therapeutic Interventions: 1 to 1 sessions, Unit Group sessions and Medication administration.  Evaluation of Outcomes: Progressing   RN Treatment Plan for Primary Diagnosis: Major depressive disorder, recurrent severe without psychotic features ([place]) Long Term Goal(s): Knowledge of disease and therapeutic regimen to maintain health will improve  Short Term  Goals: Ability to remain free from injury will improve, Ability to demonstrate self-control, Ability to participate in  decision making will improve, Ability to disclose and discuss suicidal ideas and Ability to identify and develop effective coping behaviors will improve  Medication Management: RN will administer medications as ordered by provider, will assess and evaluate patient's response and provide education to patient for prescribed medication. RN will report any adverse and/or side effects to prescribing provider.  Therapeutic Interventions: 1 on 1 counseling sessions, Psychoeducation, Medication administration, Evaluate responses to treatment, Monitor vital signs and CBGs as ordered, Perform/monitor CIWA, COWS, AIMS and Fall Risk screenings as ordered, Perform wound care treatments as ordered.  Evaluation of Outcomes: Progressing   LCSW Treatment Plan for Primary Diagnosis: Major depressive disorder, recurrent severe without psychotic features (HCC) Long Term Goal(s): Safe transition to appropriate next level of care at discharge, Engage patient in therapeutic group addressing interpersonal concerns.  Short Term Goals: Engage patient in aftercare planning with referrals and resources, Increase social support, Facilitate patient progression through stages of change regarding substance use diagnoses and concerns, Identify triggers associated with mental health/substance abuse issues and Increase skills for wellness and recovery  Therapeutic Interventions: Assess for all discharge needs, 1 to 1 time with Social worker, Explore available resources and support systems, Assess for adequacy in community support network, Educate family and significant other(s) on suicide prevention, Complete Psychosocial Assessment, Interpersonal group therapy.  Evaluation of Outcomes: Progressing   Progress in Treatment: Attending groups: Yes. Participating in groups: Yes. Taking medication as prescribed: Yes. Toleration medication: Yes. Family/Significant other contact made: No, will contact:  pt declined collateral  contact Patient understands diagnosis: Yes. Discussing patient identified problems/goals with staff: Yes. Medical problems stabilized or resolved: Yes. Denies suicidal/homicidal ideation: Yes. Issues/concerns per patient self-inventory: No. Other: N/A  New problem(s) identified: No, Describe:  none  New Short Term/Long Term Goal(s): Detox, elimination of AVH/symptoms of psychosis, medication management for mood stabilization; elimination of SI thoughts; development of comprehensive mental wellness/sobriety plan.   Patient Goals:  "I just want to feel better"  Discharge Plan or Barriers: SPE pamphlet, Mobile Crisis information, and AA/NA information provided to patient for additional community support and resources. Pt is agreeable to referral to [facility]. CSW assessing for appropriate referrals. UPDATE 05/05/2019: Pt has been accepted to [facility] and will discharge tomorrow morning 05/06/19 for admission to [facility].  Reason for Continuation of Hospitalization: Depression Medication stabilization Withdrawal symptoms  Estimated Length of Stay: Tomorrow 05/06/2019  Recreational Therapy: Patient Stressors: Death Patient Goal: Patient will engage in groups without prompting or encouragement from LRT x3 group sessions within 5 recreation therapy group sessions   Attendees: Patient:  05/05/2019 9:43 AM  Physician: Dr Toni Amendlapacs MD 05/05/2019 9:43 AM  Nursing:  05/05/2019 9:43 AM  RN Care Manager: 05/05/2019 9:43 AM  Social Worker: Zollie Scalelivia Elyna Pangilinan LCSW 05/05/2019 9:43 AM  Recreational Therapist:  05/05/2019 9:43 AM  Other:  05/05/2019 9:43 AM  Other:  05/05/2019 9:43 AM  Other: 05/05/2019 9:43 AM    Scribe for Treatment Team: Charlann Langelivia K Ellamarie Naeve, LCSW 05/05/2019 9:43 AM

## 2019-05-05 NOTE — Progress Notes (Signed)
Patient is alert and stable , appropriate upon approach , socializing with peers and with out any complains , takes her medications with out any noticeable side effects , denies any suicide or homicidal ideations and there is no signs hallucinations and delusions , patient have no safety concerns at this time , 15 minutes safety checks is maintained no distress.

## 2019-05-05 NOTE — Progress Notes (Signed)
  [facility] Adult Case Management Discharge Plan :  Will you be returning to the same living situation after discharge:  No. At discharge, do you have transportation home?: Yes,  will take [facility] to [place] 9/7 at 7:45 am Do you have the ability to pay for your medications: Yes,  mental health  Release of information consent forms completed and in the chart;    Patient to Follow up at: [facility].   Why: You have been accepted for admissions to [facility] for Monday 05/06/19 at 9am. Thank You! Contact information: [address] [place] [address] [phone]           Next level of care provider has access to [place] and Suicide Prevention discussed: Yes,  SPE completed with pt as pt declined collateral contact  Have you used any form of tobacco in the last 30 days? (Cigarettes, Smokeless Tobacco, Cigars, and/or Pipes): Yes  Has patient been referred to the Quitline?: Patient refused referral  Patient has been referred for addiction treatment: Yes  Delfin Edis, LCSW 05/05/2019, 12:31 PM

## 2019-05-05 NOTE — Plan of Care (Signed)
Sleep is adequate no distress.   Problem: Education: Goal: Ability to make informed decisions regarding treatment will improve Outcome: Progressing   Problem: Coping: Goal: Coping ability will improve Outcome: Progressing   Problem: Health Behavior/Discharge Planning: Goal: Identification of resources available to assist in meeting health care needs will improve Outcome: Progressing   Problem: Medication: Goal: Compliance with prescribed medication regimen will improve Outcome: Progressing   Problem: Self-Concept: Goal: Ability to disclose and discuss suicidal ideas will improve Outcome: Progressing Goal: Will verbalize positive feelings about self Outcome: Progressing   Problem: Education: Goal: Utilization of techniques to improve thought processes will improve Outcome: Progressing Goal: Knowledge of the prescribed therapeutic regimen will improve Outcome: Progressing   Problem: Activity: Goal: Interest or engagement in leisure activities will improve Outcome: Progressing Goal: Imbalance in normal sleep/wake cycle will improve Outcome: Progressing   Problem: Coping: Goal: Coping ability will improve Outcome: Progressing Goal: Will verbalize feelings Outcome: Progressing   Problem: Health Behavior/Discharge Planning: Goal: Ability to make decisions will improve Outcome: Progressing Goal: Compliance with therapeutic regimen will improve Outcome: Progressing   Problem: Role Relationship: Goal: Will demonstrate positive changes in social behaviors and relationships Outcome: Progressing   Problem: Safety: Goal: Ability to disclose and discuss suicidal ideas will improve Outcome: Progressing Goal: Ability to identify and utilize support systems that promote safety will improve Outcome: Progressing   Problem: Self-Concept: Goal: Will verbalize positive feelings about self Outcome: Progressing Goal: Level of anxiety will decrease Outcome: Progressing    Problem: Education: Goal: Ability to state activities that reduce stress will improve Outcome: Progressing   Problem: Coping: Goal: Ability to identify and develop effective coping behavior will improve Outcome: Progressing   Problem: Self-Concept: Goal: Ability to identify factors that promote anxiety will improve Outcome: Progressing Goal: Level of anxiety will decrease Outcome: Progressing Goal: Ability to modify response to factors that promote anxiety will improve Outcome: Progressing

## 2019-05-05 NOTE — Progress Notes (Signed)
[facility] MD Progress Note  05/05/2019 5:25 PM Christina Cohen  MRN:  [id number] Subjective: Follow-up for this woman with depression and opiate abuse.  She has done an excellent job I think sticking it out staying at the hospital through her withdrawal with the agreement to go to the alcohol and drug abuse treatment center.  Has no new complaints tonight.  Mood is a little bit anxious but appropriately so.  No behavior problems.  Physically stable. Principal Problem: Major depressive disorder, recurrent severe without psychotic features (HCC) Diagnosis: Principal Problem:   Major depressive disorder, recurrent severe without psychotic features (HCC) Active Problems:   Opiate abuse, continuous (HCC)   Suicidal ideation  Total Time spent with patient: 30 minutes  Past Psychiatric History: Recurrent opiate abuse with several visits to the hospital with near fatal overdoses.  History of depression  Past Medical History: History reviewed. No pertinent past medical history.  Past Surgical History:  Procedure Laterality Date  . CESAREAN SECTION     Family History:  Family History  Problem Relation Age of Onset  . Diabetes Maternal Grandmother   . Thyroid disease Maternal Grandmother    Family Psychiatric  History: See previous Social History:  Social History   Substance and Sexual Activity  Alcohol Use No  . Alcohol/week: 0.0 standard drinks     Social History   Substance and Sexual Activity  Drug Use Yes    Social History   Socioeconomic History  . Marital status: Legally Separated    Spouse name: Not on file  . Number of children: Not on file  . Years of education: Not on file  . Highest education level: Not on file  Occupational History  . Not on file  Social Needs  . Financial resource strain: Not on file  . Food insecurity    Worry: Not on file    Inability: Not on file  . Transportation needs    Medical: Not on file    Non-medical: Not on file  Tobacco Use  . Smoking  status: Current Every Day Smoker    Packs/day: 1.00    Years: 10.00    Pack years: 10.00    Types: Cigarettes  . Smokeless tobacco: Never Used  Substance and Sexual Activity  . Alcohol use: No    Alcohol/week: 0.0 standard drinks  . Drug use: Yes  . Sexual activity: Not on file  Lifestyle  . Physical activity    Days per week: Not on file    Minutes per session: Not on file  . Stress: Not on file  Relationships  . Social Musician on phone: Not on file    Gets together: Not on file    Attends religious service: Not on file    Active member of club or organization: Not on file    Attends meetings of clubs or organizations: Not on file    Relationship status: Not on file  Other Topics Concern  . Not on file  Social History Narrative  . Not on file   Additional Social History:                         Sleep: Fair  Appetite:  Fair  Current Medications: Current Facility-Administered Medications  Medication Dose Route Frequency Provider Last Rate Last Dose  . acetaminophen (TYLENOL) tablet 650 mg  650 mg Oral Q6H PRN Charm Rings, NP   650 mg at 05/04/19 1346  .  alum & mag hydroxide-simeth (MAALOX/MYLANTA) 200-200-20 MG/5ML suspension 30 mL  30 mL Oral Q4H PRN Charm RingsLord, Jamison Y, NP      . cloNIDine (CATAPRES) tablet 0.1 mg  0.1 mg Oral Q6H PRN Aleesha Ringstad T, MD   0.1 mg at 05/05/19 0900  . feeding supplement (ENSURE ENLIVE) (ENSURE ENLIVE) liquid 237 mL  237 mL Oral BID BM Rori Goar T, MD   237 mL at 05/05/19 1500  . hydrOXYzine (ATARAX/VISTARIL) tablet 50 mg  50 mg Oral Q6H PRN Jhordan Mckibben, Jackquline DenmarkJohn T, MD   50 mg at 05/05/19 1330  . magnesium hydroxide (MILK OF MAGNESIA) suspension 30 mL  30 mL Oral Daily PRN Charm RingsLord, Jamison Y, NP      . methocarbamol (ROBAXIN) tablet 750 mg  750 mg Oral Q6H PRN Ethlyn Alto, Jackquline DenmarkJohn T, MD   750 mg at 05/04/19 2125  . multivitamin with minerals tablet 1 tablet  1 tablet Oral Daily Haru Anspaugh, Jackquline DenmarkJohn T, MD   1 tablet at 05/05/19 0900  .  nicotine (NICODERM CQ - dosed in mg/24 hours) patch 21 mg  21 mg Transdermal Daily Radley Barto, Jackquline DenmarkJohn T, MD   21 mg at 05/05/19 1226  . ondansetron (ZOFRAN-ODT) disintegrating tablet 8 mg  8 mg Oral Q8H PRN Gabi Mcfate T, MD      . traZODone (DESYREL) tablet 100 mg  100 mg Oral QHS Heman Que T, MD   100 mg at 05/04/19 2125    Lab Results: No results found for this or any previous visit (from the past 48 hour(s)).  Blood Alcohol level:  Lab Results  Component Value Date   ETH <10 04/28/2019   ETH <10 04/25/2019    Metabolic Disorder Labs: No results found for: HGBA1C, MPG No results found for: PROLACTIN No results found for: CHOL, TRIG, HDL, CHOLHDL, VLDL, LDLCALC  Physical Findings: AIMS: Facial and Oral Movements Muscles of Facial Expression: None, normal Lips and Perioral Area: None, normal Jaw: None, normal Tongue: None, normal,Extremity Movements Upper (arms, wrists, hands, fingers): None, normal Lower (legs, knees, ankles, toes): None, normal, Trunk Movements Neck, shoulders, hips: None, normal, Overall Severity Severity of abnormal movements (highest score from questions above): None, normal Incapacitation due to abnormal movements: None, normal Patient's awareness of abnormal movements (rate only patient's report): No Awareness, Dental Status Current problems with teeth and/or dentures?: No Does patient usually wear dentures?: No  CIWA:  CIWA-Ar Total: 2 COWS:  COWS Total Score: 1  Musculoskeletal: Strength & Muscle Tone: within normal limits Gait & Station: normal Patient leans: N/A  Psychiatric Specialty Exam: Physical Exam  Nursing note and vitals reviewed. Constitutional: She appears well-developed and well-nourished.  HENT:  Head: Normocephalic and atraumatic.  Eyes: Pupils are equal, round, and reactive to light. Conjunctivae are normal.  Neck: Normal range of motion.  Cardiovascular: Regular rhythm and normal heart sounds.  Respiratory: Effort normal.   GI: Soft.  Musculoskeletal: Normal range of motion.  Neurological: She is alert.  Skin: Skin is warm and dry.  Psychiatric: She has a normal mood and affect. Her speech is normal and behavior is normal. Judgment and thought content normal. Cognition and memory are normal.    Review of Systems  Constitutional: Positive for weight loss.  HENT: Negative.   Eyes: Negative.   Respiratory: Negative.   Cardiovascular: Negative.   Gastrointestinal: Negative.   Musculoskeletal: Negative.   Skin: Negative.   Neurological: Negative.   Psychiatric/Behavioral: Positive for substance abuse. Negative for depression, hallucinations, memory loss and suicidal ideas.  The patient is not nervous/anxious and does not have insomnia.     Blood pressure 100/75, pulse 72, temperature 97.8 F (36.6 C), temperature source Oral, resp. rate 18, height 5\' 4"  (1.626 m), weight 49 kg, SpO2 100 %.Body mass index is 18.54 kg/m.  General Appearance: Casual  Eye Contact:  Good  Speech:  Clear and Coherent  Volume:  Normal  Mood:  Euthymic  Affect:  Constricted  Thought Process:  Goal Directed  Orientation:  Full (Time, Place, and Person)  Thought Content:  Logical  Suicidal Thoughts:  No  Homicidal Thoughts:  No  Memory:  Immediate;   Fair Recent;   Fair Remote;   Fair  Judgement:  Fair  Insight:  Fair  Psychomotor Activity:  Normal  Concentration:  Concentration: Fair  Recall:  [employer] of Knowledge:  Fair  Language:  Fair  Akathisia:  No  Handed:  Right  AIMS (if indicated):     Assets:  Desire for Improvement  ADL's:  Intact  Cognition:  WNL  Sleep:  Number of Hours: 8     Treatment Plan Summary: Plan No change to medication.  I will prepare discharge summary and all of the correct paperwork so that the orders are all in place for discharge tomorrow morning to the alcohol and drug abuse treatment program.  Transportation is already been arranged for about 745.  Patient agrees to plan.  Alethia Berthold, MD 05/05/2019, 5:25 PM

## 2019-05-05 NOTE — [facility] Group Notes (Signed)
LCSW Group Therapy Note  05/05/2019 11:34 AM   Type of Therapy and Topic: Group Therapy: Feelings Around Returning Home & Establishing a Supportive Framework and Supporting Oneself When Supports Not Available   Participation Level:  Active   Description of Group:  Patients first processed thoughts and feelings about upcoming discharge. These included fears of upcoming changes, lack of change, new living environments, judgements and expectations from others and overall stigma of mental health issues. The group then discussed the definition of a supportive framework, what that looks and feels like, and how do to discern it from an unhealthy non-supportive network. The group identified different types of supports as well as what to do when your family/friends are less than helpful or unavailable   Therapeutic Goals  1. Patient will identify one healthy supportive network that they can use at discharge. 2. Patient will identify one factor of a supportive framework and how to tell it from an unhealthy network. 3. Patient able to identify one coping skill to use when they do not have positive supports from others. 4. Patient will demonstrate ability to communicate their needs through discussion and/or role plays.   Summary of Patient Progress:  Pt was appropriate and respectful in group. Pt reported that she is currently feeling anxious, nervous, and excited about being discharged tomorrow. Pt reported she is nervous and anxious because she does not know what to expect at [facility] and it would be easier for her if she discharged home and then went to treatment instead of going straight to treatment. Pt reported that a supportive network is people who have your back no matter what and does not enable you.    Therapeutic Modalities Cognitive Behavioral Therapy Motivational Interviewing    Evalina Field, MSW, LCSW Clinical Social Work 05/05/2019 11:34 AM

## 2019-05-05 NOTE — Progress Notes (Signed)
Patient alert and oriented x 4, affect is flat and sad she appears withdrawn in her room, she has two phone calls back to back but she refused to get oob to answer the phone writer approached patient and she stated " I don't feel like talking to anybody not today" patient does not appears to be withdrawing from drugs, she's  Currently off COWS no tremors or shakes, no GI issues or sweating VS are WNL.   Patient is receptive to staff, her thoughts are organized and coherent no she rated depression a 5/10 on a scale  0 low - 10 high she denies SI/HI/AVH, 15 minutes safety checks maintained will continue to monitor closely.

## 2019-05-05 NOTE — Discharge Summary (Signed)
Physician Discharge Summary Note  Patient:  Christina Cohen is an 33 y.o., female MRN:  [id number] DOB:  1986-07-30 Patient phone:  [phone] (home)  Patient address:   [place] [address],  Total Time spent with patient: 45 minutes  Date of Admission:  04/29/2019 Date of Discharge: May 06, 2019  Reason for Admission: Patient was admitted in transfer from the medical service after she initially presented to the hospital after an overdose of heroin.  After medical stabilization and evaluation by the psychiatry consult team she was brought downstairs for further stabilization  Principal Problem: Major depressive disorder, recurrent severe without psychotic features [facility]) Discharge Diagnoses: Principal Problem:   Major depressive disorder, recurrent severe without psychotic features (HCC) Active Problems:   Opiate abuse, continuous (HCC)   Suicidal ideation   Past Psychiatric History: Patient has a long history of substance abuse with opiate abuse particularly intravenous heroin abuse being a major longstanding problem.  She has struggled for years and has had only brief periods of trying to stay clean.  Other than that as far as I know no other psychiatric hospitalization.  Denies any history of intentional suicidal behavior.  Past Medical History: History reviewed. No pertinent past medical history.  Past Surgical History:  Procedure Laterality Date  . CESAREAN SECTION     Family History:  Family History  Problem Relation Age of Onset  . Diabetes Maternal Grandmother   . Thyroid disease Maternal Grandmother    Family Psychiatric  History: None reported Social History:  Social History   Substance and Sexual Activity  Alcohol Use No  . Alcohol/week: 0.0 standard drinks     Social History   Substance and Sexual Activity  Drug Use Yes    Social History   Socioeconomic History  . Marital status: Legally Separated    Spouse name: Not on file  . Number of  children: Not on file  . Years of education: Not on file  . Highest education level: Not on file  Occupational History  . Not on file  Social Needs  . Financial resource strain: Not on file  . Food insecurity    Worry: Not on file    Inability: Not on file  . Transportation needs    Medical: Not on file    Non-medical: Not on file  Tobacco Use  . Smoking status: Current Every Day Smoker    Packs/day: 1.00    Years: 10.00    Pack years: 10.00    Types: Cigarettes  . Smokeless tobacco: Never Used  Substance and Sexual Activity  . Alcohol use: No    Alcohol/week: 0.0 standard drinks  . Drug use: Yes  . Sexual activity: Not on file  Lifestyle  . Physical activity    Days per week: Not on file    Minutes per session: Not on file  . Stress: Not on file  Relationships  . Social Musician on phone: Not on file    Gets together: Not on file    Attends religious service: Not on file    Active member of club or organization: Not on file    Attends meetings of clubs or organizations: Not on file    Relationship status: Not on file  Other Topics Concern  . Not on file  Social History Narrative  . Not on file    Hospital Course: Patient was admitted to the psychiatric ward and was having acute symptoms of opiate withdrawal including diffuse  pain fatigue malaise nausea diarrhea.  She was treated with Suboxone for detox for 72 hours.  I had explained to her that 72 hours in the hospital was the maximum that we could use.  As we near the end of that.  The patient requested discharge.  I told her that I had strong misgivings about this plan because I knew that her risk of relapse was nearly 100% and that she was already engaged in behavior that put her at the brink of dying.  After having this discussion the patient agreed to stay and go if possible to an inpatient rehab type program.  In return we made sure that she had plenty of other medication that can be used for controlling  opiate withdrawal symptoms including clonidine Robaxin Imodium and Zofran.  Also Vistaril for anxiety.  Patient was able to make it through the next several days.  She is eating and drinking normally.  She has been interacting appropriately with staff and patients and attending groups.  On the night prior to discharge she tells me she realizes that she is at risk of death if she does not change her behavior and therefore is agreeable to going to the treatment program.  We have made every arrangement for voluntary transfer tomorrow morning.  She was never started on any specific psychiatric medicine such as antidepressants as there did not seem to be a really clear indication for it although as she continues to maintain some sobriety this certainly could be reassessed.  Physical Findings: AIMS: Facial and Oral Movements Muscles of Facial Expression: None, normal Lips and Perioral Area: None, normal Jaw: None, normal Tongue: None, normal,Extremity Movements Upper (arms, wrists, hands, fingers): None, normal Lower (legs, knees, ankles, toes): None, normal, Trunk Movements Neck, shoulders, hips: None, normal, Overall Severity Severity of abnormal movements (highest score from questions above): None, normal Incapacitation due to abnormal movements: None, normal Patient's awareness of abnormal movements (rate only patient's report): No Awareness, Dental Status Current problems with teeth and/or dentures?: No Does patient usually wear dentures?: No  CIWA:  CIWA-Ar Total: 2 COWS:  COWS Total Score: 1  Musculoskeletal: Strength & Muscle Tone: within normal limits Gait & Station: normal Patient leans: N/A  Psychiatric Specialty Exam: Physical Exam  Nursing note and vitals reviewed. Constitutional: She appears well-developed and well-nourished.  HENT:  Head: Normocephalic and atraumatic.  Eyes: Pupils are equal, round, and reactive to light. Conjunctivae are normal.  Neck: Normal range of motion.   Cardiovascular: Regular rhythm and normal heart sounds.  Respiratory: Effort normal.  GI: Soft.  Musculoskeletal: Normal range of motion.  Neurological: She is alert.  Skin: Skin is warm and dry.  Psychiatric: Judgment normal. Her affect is blunt. Her speech is delayed. She is slowed. Thought content is not paranoid. Cognition and memory are normal. She expresses no homicidal and no suicidal ideation.    Review of Systems  Constitutional: Negative.   HENT: Negative.   Eyes: Negative.   Respiratory: Negative.   Cardiovascular: Negative.   Gastrointestinal: Negative.   Musculoskeletal: Negative.   Skin: Negative.   Neurological: Negative.   Psychiatric/Behavioral: Positive for substance abuse. Negative for depression, hallucinations and suicidal ideas. The patient is nervous/anxious.     Blood pressure 100/75, pulse 72, temperature 97.8 F (36.6 C), temperature source Oral, resp. rate 18, height 5\' 4"  (1.626 m), weight 49 kg, SpO2 100 %.Body mass index is 18.54 kg/m.  General Appearance: Casual  Eye Contact:  Good  Speech:  Clear  and Coherent  Volume:  Normal  Mood:  Euthymic  Affect:  Constricted  Thought Process:  Goal Directed  Orientation:  Full (Time, Place, and Person)  Thought Content:  Logical  Suicidal Thoughts:  No  Homicidal Thoughts:  No  Memory:  Immediate;   Fair Recent;   Fair Remote;   Fair  Judgement:  Fair  Insight:  Fair  Psychomotor Activity:  Normal  Concentration:  Concentration: Fair  Recall:  [employer]  Fund of Knowledge:  Fair  Language:  Fair  Akathisia:  No  Handed:  Right  AIMS (if indicated):     Assets:  Desire for Improvement Resilience  ADL's:  Intact  Cognition:  WNL  Sleep:  Number of Hours: 8     Have you used any form of tobacco in the last 30 days? (Cigarettes, Smokeless Tobacco, Cigars, and/or Pipes): Yes  Has this patient used any form of tobacco in the last 30 days? (Cigarettes, Smokeless Tobacco, Cigars, and/or Pipes) Yes,  Yes, A prescription for an FDA-approved tobacco cessation medication was offered at discharge and the patient refused  Blood Alcohol level:  Lab Results  Component Value Date   ETH <10 04/28/2019   ETH <10 04/25/2019    Metabolic Disorder Labs:  No results found for: HGBA1C, MPG No results found for: PROLACTIN No results found for: CHOL, TRIG, HDL, CHOLHDL, VLDL, LDLCALC  See Psychiatric Specialty Exam and Suicide Risk Assessment completed by Attending Physician prior to discharge.  Discharge destination:  [facility]  Is patient on multiple antipsychotic therapies at discharge:  No   Has Patient had three or more failed trials of antipsychotic monotherapy by history:  No  Recommended Plan for Multiple Antipsychotic Therapies: NA  Discharge Instructions    Diet - low sodium heart healthy   Complete by: As directed    Increase activity slowly   Complete by: As directed      Allergies as of 05/05/2019   No Known Allergies     Medication List    TAKE these medications     Indication  methocarbamol 750 MG tablet Commonly known as: ROBAXIN Take 1 tablet (750 mg total) by mouth every 6 (six) hours as needed for muscle spasms.  Indication: Musculoskeletal Pain   nicotine 21 mg/24hr patch Commonly known as: NICODERM CQ - dosed in mg/24 hours Place 1 patch (21 mg total) onto the skin daily. Start taking on: May 06, 2019  Indication: Nicotine Addiction   ondansetron 8 MG disintegrating tablet Commonly known as: ZOFRAN-ODT Take 1 tablet (8 mg total) by mouth every 8 (eight) hours as needed for nausea or vomiting.  Indication: Nausea and Vomiting   traZODone 100 MG tablet Commonly known as: DESYREL Take 1 tablet (100 mg total) by mouth at bedtime.  Indication: Trouble Sleeping      Follow-up Information    [facility] Follow [facility].   Why: You have been accepted for admissions to [facility] for Monday 05/06/19 at 9am. Thank You! Contact  information: [address] [place] [id number] [phone]           Follow-up recommendations:  Activity:  Activity as tolerated Diet:  Regular diet Other:  Follow-up with the resources at the alcohol and drug abuse treatment center and work with them on further plans after the next couple weeks for ways to continue sobriety  Comments: Patient agreeable to plan.  No longer meets commitment criteria.  Agreeable to transfer to substance abuse  rehab  Signed: Mordecai RasmussenJohn Thekla Colborn, MD 05/05/2019, 5:34 PM

## 2019-05-05 NOTE — Plan of Care (Signed)
D- Patient alert and oriented. Patient presents in a pleasant mood on assessment stating that she slept "ok last night and voiced complaints of restless legs, reporting that she feels that this is coming from her withdrawals. This Probation officer administered patient medication for withdrawal symptoms. Patient denies any signs/symptoms of depression, however she endorsed both depression and anxiety on her self-inventory. Patient states that she is anxious because she is "going to [employer]. Patient also denies SI, HI, AVH, and pain at this time. Patient's goal for today is "staying clean", in which she will "not do drugs" in order to accomplish her goal.  A- Scheduled medications administered to patient, per MD orders. Support and encouragement provided.  Routine safety checks conducted every 15 minutes.  Patient informed to notify staff with problems or concerns.  R- No adverse drug reactions noted. Patient contracts for safety at this time. Patient compliant with medications and treatment plan. Patient receptive, calm, and cooperative. Patient interacts well with others on the unit.  Patient remains safe at this time.  Problem: Education: Goal: Ability to make informed decisions regarding treatment will improve Outcome: Progressing   Problem: Coping: Goal: Coping ability will improve Outcome: Progressing   Problem: Health Behavior/Discharge Planning: Goal: Identification of resources available to assist in meeting health care needs will improve Outcome: Progressing   Problem: Medication: Goal: Compliance with prescribed medication regimen will improve Outcome: Progressing   Problem: Self-Concept: Goal: Ability to disclose and discuss suicidal ideas will improve Outcome: Progressing Goal: Will verbalize positive feelings about self Outcome: Progressing   Problem: Education: Goal: Utilization of techniques to improve thought processes will improve Outcome: Progressing Goal: Knowledge of  the prescribed therapeutic regimen will improve Outcome: Progressing   Problem: Activity: Goal: Interest or engagement in leisure activities will improve Outcome: Progressing Goal: Imbalance in normal sleep/wake cycle will improve Outcome: Progressing   Problem: Coping: Goal: Coping ability will improve Outcome: Progressing Goal: Will verbalize feelings Outcome: Progressing   Problem: Health Behavior/Discharge Planning: Goal: Ability to make decisions will improve Outcome: Progressing Goal: Compliance with therapeutic regimen will improve Outcome: Progressing   Problem: Role Relationship: Goal: Will demonstrate positive changes in social behaviors and relationships Outcome: Progressing   Problem: Safety: Goal: Ability to disclose and discuss suicidal ideas will improve Outcome: Progressing Goal: Ability to identify and utilize support systems that promote safety will improve Outcome: Progressing   Problem: Self-Concept: Goal: Will verbalize positive feelings about self Outcome: Progressing Goal: Level of anxiety will decrease Outcome: Progressing   Problem: Education: Goal: Ability to state activities that reduce stress will improve Outcome: Progressing   Problem: Coping: Goal: Ability to identify and develop effective coping behavior will improve Outcome: Progressing   Problem: Self-Concept: Goal: Ability to identify factors that promote anxiety will improve Outcome: Progressing Goal: Level of anxiety will decrease Outcome: Progressing Goal: Ability to modify response to factors that promote anxiety will improve Outcome: Progressing

## 2019-05-05 NOTE — BHH Suicide Risk Assessment (Signed)
[facility] Discharge Suicide Risk Assessment   Principal Problem: Major depressive disorder, recurrent severe without psychotic features ([place]) Discharge Diagnoses: Principal Problem:   Major depressive disorder, recurrent severe without psychotic features ([place]) Active Problems:   Opiate abuse, continuous ([place])   Suicidal ideation   Total Time spent with patient: 30 minutes  Musculoskeletal: Strength & Muscle Tone: within normal limits Gait & Station: normal Patient leans: N/A  Psychiatric Specialty Exam: Review of Systems  Constitutional: Negative.   HENT: Negative.   Eyes: Negative.   Respiratory: Negative.   Cardiovascular: Negative.   Gastrointestinal: Negative.   Musculoskeletal: Negative.   Skin: Negative.   Neurological: Negative.   Psychiatric/Behavioral: Positive for substance abuse. Negative for depression, hallucinations, memory loss and suicidal ideas. The patient is not nervous/anxious and does not have insomnia.     Blood pressure 100/75, pulse 72, temperature 97.8 F (36.6 C), temperature source Oral, resp. rate 18, height 5\' 4"  (1.626 m), weight 49 kg, SpO2 100 %.Body mass index is 18.54 kg/m.  General Appearance: Casual  Eye Contact::  Good  Speech:  Normal Rate409  Volume:  Normal  Mood:  Euthymic  Affect:  Constricted  Thought Process:  Goal Directed  Orientation:  Full (Time, Place, and Person)  Thought Content:  Logical  Suicidal Thoughts:  No  Homicidal Thoughts:  No  Memory:  Immediate;   Fair Recent;   Fair Remote;   Fair  Judgement:  Fair  Insight:  Fair  Psychomotor Activity:  Normal  Concentration:  Fair  Recall:  [employer] [place]  Language: Fair  Akathisia:  No  Handed:  Right  AIMS (if indicated):     Assets:  Desire for Improvement Housing Physical Health Resilience  Sleep:  Number of Hours: 8  Cognition: WNL  ADL's:  Intact   Mental Status Per Nursing Assessment::   On Admission:  NA  Demographic Factors:   Caucasian, Low socioeconomic status and Unemployed  Loss Factors: Decline in physical health  Historical Factors: Impulsivity  Risk Reduction Factors:   Positive social support and Positive coping skills or problem solving skills  Continued Clinical Symptoms:  Depression:   Comorbid alcohol abuse/dependence Alcohol/Substance Abuse/Dependencies  Cognitive Features That Contribute To Risk:  None    Suicide Risk:  Minimal: No identifiable suicidal ideation.  Patients presenting with no risk factors but with morbid ruminations; may be classified as minimal risk based on the severity of the depressive symptoms  [facility] Follow up.   Why: You have been accepted for admissions to [facility] for Monday 05/06/19 at 9am. Thank You! Contact information: [place] [address] [phone]           Plan Of Care/Follow-up recommendations:  Activity:  Activity as tolerated Diet:  Regular diet Other:  Patient is going to be going to the alcohol and drug abuse treatment Center in [place] directly from discharge here.  Prescriptions for medications will be provided.  Alethia Berthold, MD 05/05/2019, 5:30 PM

## 2019-05-06 NOTE — Progress Notes (Signed)
Recreation Therapy Notes  INPATIENT RECREATION TR PLAN  Patient Details Name: CHEROKEE BOCCIO MRN: [id number] DOB: 1986-02-10 Today's Date: 05/06/2019  Rec Therapy Plan Is patient appropriate for Therapeutic Recreation?: Yes Treatment times per week: at least 3 Estimated Length of Stay: 5-7 days TR Treatment/Interventions: Group participation (Comment)  Discharge Criteria Pt will be discharged from therapy if:: Discharged Treatment plan/goals/alternatives discussed and agreed upon by:: Patient/family  Discharge Summary Short term goals set: Patient will engage in groups without prompting or encouragement from LRT x3 group sessions within 5 recreation therapy group sessions Short term goals met: Complete Progress toward goals comments: Groups attended Which groups?: Goal setting, Communication, Other (Comment)(Problem Solving) Reason goals not met: N/A Therapeutic equipment acquired: N/A Reason patient discharged from therapy: Discharge from hospital Pt/family agrees with progress & goals achieved: Yes Date patient discharged from therapy: 05/06/19   Adrieana Fennelly 05/06/2019, 11:19 AM

## 2019-05-06 NOTE — Plan of Care (Signed)
  Problem: Group Participation Goal: STG - Patient will engage in groups without prompting or encouragement from LRT x3 group sessions within 5 recreation therapy group sessions Description: STG - Patient will engage in groups without prompting or encouragement from LRT x3 group sessions within 5 recreation therapy group sessions Outcome: Completed/Met

## 2019-05-06 NOTE — Progress Notes (Signed)
Patient ID: Christina Cohen, female   DOB: [birth year], 33 y.o.   MRN: [id number]  Discharge Note:  Patient denies SI/HI/AVH at this time. Discharge instructions, AVS, prescriptions, and transition record gone over with patient. Patient agrees to comply with medication management, follow-up visit, and outpatient therapy. Patient belongings returned to patient. Patient questions and concerns addressed and answered. Patient ambulatory off unit. Patient discharged to [facility] via [facility] transportation services.

## 2019-05-06 NOTE — Progress Notes (Signed)
Discharge instructions and  plan of care and education is provided to patient, prescription medications and follow up instructions are provided to patient , patient clothing's/ materials and money will be processed and return to patient /security.
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
## 2020-10-01 IMAGING — DX PORTABLE CHEST - 1 VIEW
1 series · 1 of 1 positions shown · non-contrast
Comparison: 02/12/2018

CLINICAL DATA: Drug overdose.

EXAM:
PORTABLE CHEST 1 VIEW

[chest ap]
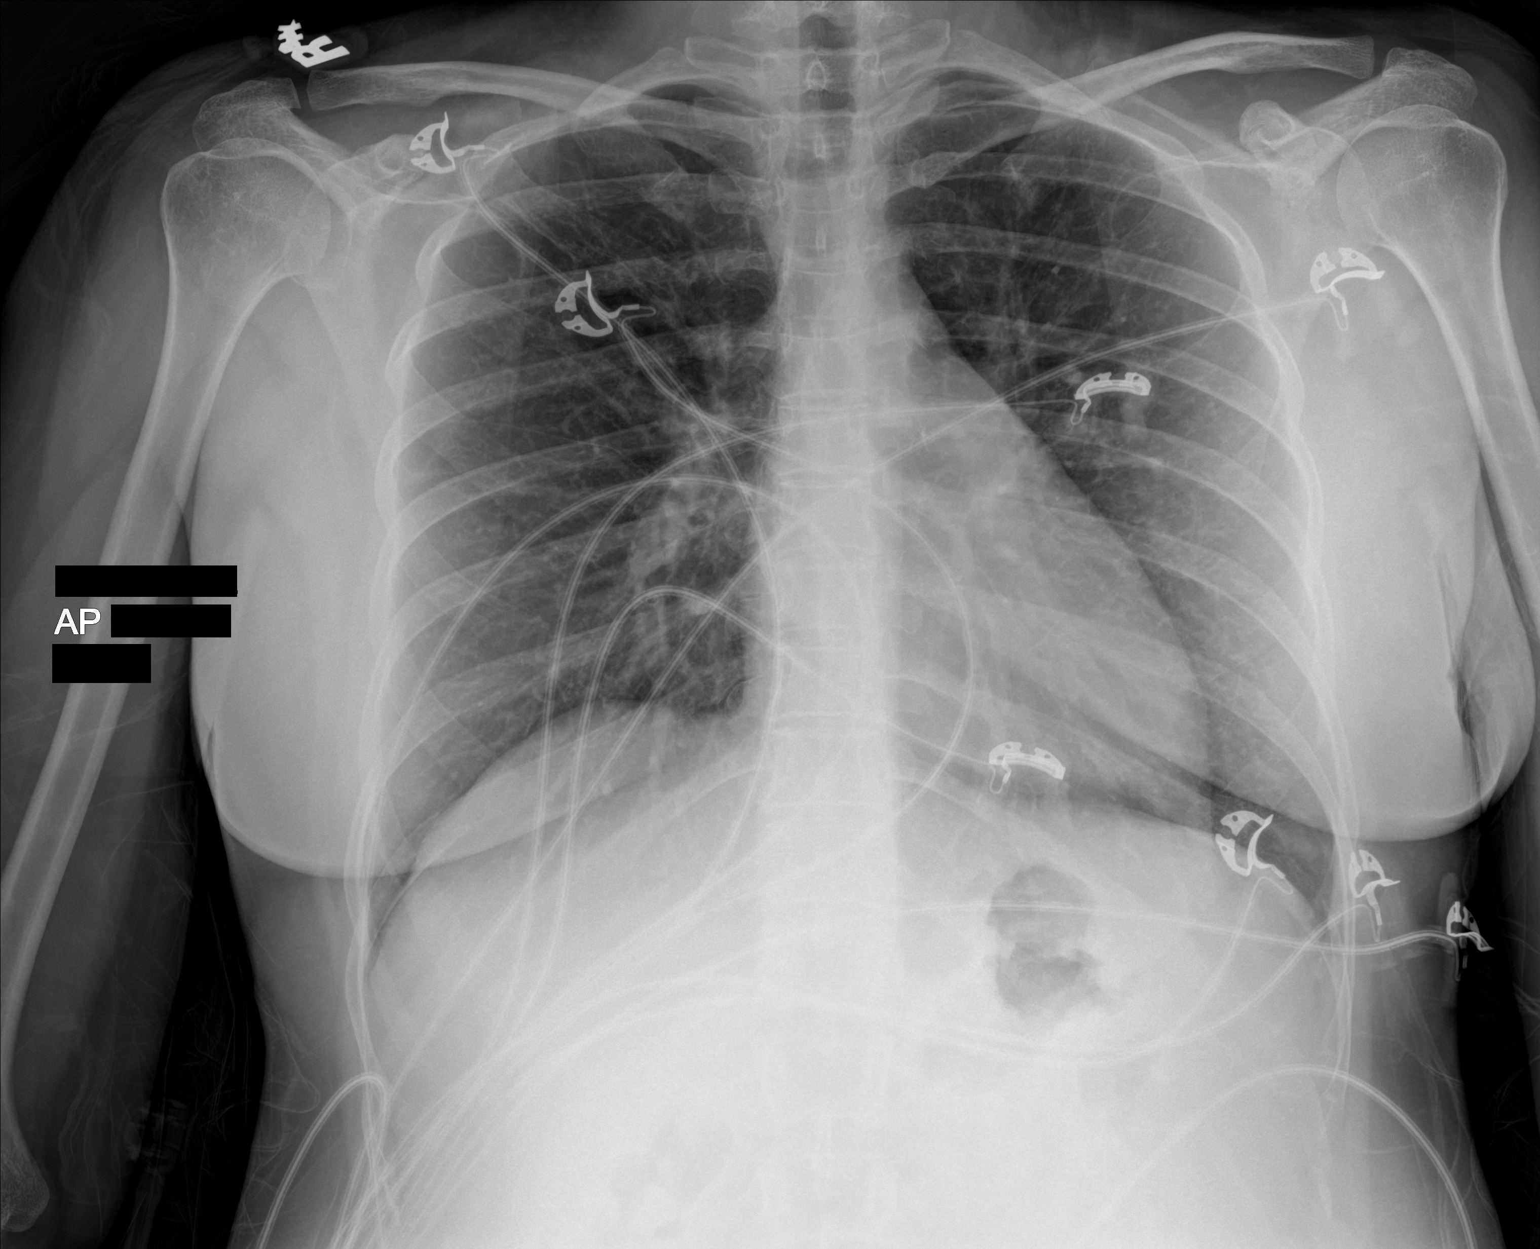

[1 of 1 positions shown; findings below may reference images not displayed]

FINDINGS: The cardiomediastinal contours are normal. The lungs are clear.
Pulmonary vasculature is normal. No consolidation, pleural effusion,
or pneumothorax. No acute osseous abnormalities are seen.
IMPRESSION: No acute chest findings.
